# Patient Record
Sex: Male | Born: 1985 | Race: Black or African American | Hispanic: No | Marital: Single | Smoking: Never smoker
Health system: Southern US, Community
[De-identification: ages and names within clinical notes are randomized; demographics above are authoritative.]

## PROBLEM LIST (undated history)

## (undated) DIAGNOSIS — J45909 Unspecified asthma, uncomplicated: Secondary | ICD-10-CM

---
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
## 2012-09-01 ENCOUNTER — Emergency Department (HOSPITAL_COMMUNITY)
Admission: EM | Admit: 2012-09-01 | Discharge: 2012-09-01 | Disposition: A | Discharge status: Home / Self Care | Payer: Self-pay | Attending: Emergency Medicine | Admitting: Emergency Medicine

## 2012-09-01 ENCOUNTER — Encounter (HOSPITAL_COMMUNITY): Payer: Self-pay | Admitting: *Deleted

## 2012-09-01 ENCOUNTER — Emergency Department (HOSPITAL_COMMUNITY): Payer: Self-pay

## 2012-09-01 DIAGNOSIS — F17200 Nicotine dependence, unspecified, uncomplicated: Secondary | ICD-10-CM | POA: Insufficient documentation

## 2012-09-01 DIAGNOSIS — J45909 Unspecified asthma, uncomplicated: Secondary | ICD-10-CM | POA: Insufficient documentation

## 2012-09-01 DIAGNOSIS — A084 Viral intestinal infection, unspecified: Secondary | ICD-10-CM

## 2012-09-01 DIAGNOSIS — K5289 Other specified noninfective gastroenteritis and colitis: Secondary | ICD-10-CM | POA: Insufficient documentation

## 2012-09-01 DIAGNOSIS — B9789 Other viral agents as the cause of diseases classified elsewhere: Secondary | ICD-10-CM | POA: Insufficient documentation

## 2012-09-01 DIAGNOSIS — R509 Fever, unspecified: Secondary | ICD-10-CM | POA: Insufficient documentation

## 2012-09-01 DIAGNOSIS — R109 Unspecified abdominal pain: Secondary | ICD-10-CM | POA: Insufficient documentation

## 2012-09-01 HISTORY — DX: Unspecified asthma, uncomplicated: J45.909

## 2012-09-01 LAB — COMPREHENSIVE METABOLIC PANEL
ALT: 25 U/L (ref 0–53)
AST: 30 U/L (ref 0–37)
Alkaline Phosphatase: 63 U/L (ref 39–117)
CO2: 26 mEq/L (ref 19–32)
Calcium: 10.4 mg/dL (ref 8.4–10.5)
Chloride: 101 mEq/L (ref 96–112)
GFR calc Af Amer: >90 mL/min (ref >90)
GFR calc non Af Amer: >90 mL/min (ref >90)
Glucose, Bld: 100 mg/dL — ABNORMAL HIGH (ref 70–99)
Sodium: 140 mEq/L (ref 135–145)
Total Bilirubin: 1.2 mg/dL (ref 0.3–1.2)

## 2012-09-01 LAB — CBC WITH DIFFERENTIAL/PLATELET
Eosinophils Relative: 0 % (ref 0–5)
HCT: 45 % (ref 39.0–52.0)
Lymphocytes Relative: 6 % — ABNORMAL LOW (ref 12–46)
Lymphs Abs: 0.9 10*3/uL (ref 0.7–4.0)
MCV: 87.5 fL (ref 78.0–100.0)
Neutro Abs: 13.8 10*3/uL — ABNORMAL HIGH (ref 1.7–7.7)
Platelets: 188 10*3/uL (ref 150–400)
RBC: 5.14 MIL/uL (ref 4.22–5.81)
WBC: 15.3 10*3/uL — ABNORMAL HIGH (ref 4.0–10.5)

## 2012-09-01 MED ORDER — SODIUM CHLORIDE 0.9 % IV BOLUS (SEPSIS)
1000.0000 mL | Freq: Once | INTRAVENOUS | Status: AC
  Administered 2012-09-01: 1000 mL via INTRAVENOUS

## 2012-09-01 MED ORDER — HYDROMORPHONE HCL PF 1 MG/ML IJ SOLN
1.0000 mg | Freq: Once | INTRAMUSCULAR | Status: AC
  Administered 2012-09-01: 1 mg via INTRAVENOUS
  Filled 2012-09-01: qty 1

## 2012-09-01 MED ORDER — IOHEXOL 300 MG/ML  SOLN
100.0000 mL | Freq: Once | INTRAMUSCULAR | Status: AC | PRN
  Administered 2012-09-01: 100 mL via INTRAVENOUS

## 2012-09-01 MED ORDER — IOHEXOL 300 MG/ML  SOLN
50.0000 mL | Freq: Once | INTRAMUSCULAR | Status: AC | PRN
  Administered 2012-09-01: 50 mL via ORAL

## 2012-09-01 MED ORDER — ONDANSETRON HCL 4 MG/2ML IJ SOLN
4.0000 mg | Freq: Once | INTRAMUSCULAR | Status: AC
  Administered 2012-09-01: 4 mg via INTRAVENOUS
  Filled 2012-09-01: qty 2

## 2012-09-01 MED ORDER — ONDANSETRON HCL 4 MG PO TABS: 4.0000 mg | ORAL_TABLET | Freq: Four times a day (QID) | ORAL | Status: DC

## 2012-09-01 NOTE — ED Notes (Signed)
Patient transported to CT via stretcher per rad tech

## 2012-09-01 NOTE — ED Provider Notes (Signed)
History     CSN: [id number]  Arrival date & time 09/01/12  1243   First MD Initiated Contact with Patient 09/01/12 1411      Chief Complaint  Patient presents with  . Emesis  . Chills    (Consider location/radiation/quality/duration/timing/severity/associated sxs/prior treatment) HPI Comments: 27 year old male presents to the emergency department complaining of sudden onset vomiting x1 day beginning at 2:00 this morning. States his continued to vomit "nonstop" since then. He is unable to keep anything down. Denies abdominal pain, however states after vomiting his abdomen feels tight for short period of time. Admits to associated subjective fever and chills. Denies diarrhea. He felt fine yesterday. States he ate a corn dog and some cereal for dinner last night. No contacts With similar symptoms. Denies history of any abdominal surgeries or problems.  The history is provided by the patient.    Past Medical History  Diagnosis Date  . Asthma     History reviewed. No pertinent past surgical history.  No family history on file.  History  Substance Use Topics  . Smoking status: Current Every Day Smoker  . Smokeless tobacco: Not on file  . Alcohol Use: No      Review of Systems  Constitutional: Positive for fever, chills and appetite change.  Gastrointestinal: Positive for nausea, vomiting and abdominal pain. Negative for diarrhea. Abdominal distention: only after vomiting.  Musculoskeletal: Negative for back pain.  All other systems reviewed and are negative.    Allergies  Review of patient's allergies indicates no known allergies.  Home Medications  No current outpatient prescriptions on file.  BP 109/50  Pulse 83  Temp(Src) 98.5 F (36.9 C) (Oral)  Resp 16  SpO2 100%  Physical Exam  Nursing note and vitals reviewed. Constitutional: He is oriented to person, place, and time. He appears well-developed and well-nourished. No distress.  HENT:  Head:  Normocephalic and atraumatic.  Mouth/Throat: Uvula is midline, oropharynx is clear and moist and mucous membranes are normal.  Eyes: Conjunctivae are normal.  Neck: Normal range of motion. Neck supple.  Cardiovascular: Normal rate, regular rhythm, normal heart sounds and intact distal pulses.   Pulmonary/Chest: Effort normal and breath sounds normal. No respiratory distress.  Abdominal: Soft. Normal appearance and bowel sounds are normal. He exhibits no mass. There is tenderness in the right upper quadrant, right lower quadrant and periumbilical area. There is guarding and tenderness at McBurney's point. There is no rigidity, no rebound and no CVA tenderness.  No peritoneal signs.  Musculoskeletal: Normal range of motion. He exhibits no edema.  Neurological: He is alert and oriented to person, place, and time.  Skin: Skin is warm and dry.  Psychiatric: He has a normal mood and affect. His behavior is normal.    ED Course  Procedures (including critical care time)  Labs Reviewed  CBC WITH DIFFERENTIAL - Abnormal; Notable for the following:    WBC 15.3 (*)    Neutrophils Relative 90 (*)    Neutro Abs 13.8 (*)    Lymphocytes Relative 6 (*)    All other components within normal limits  COMPREHENSIVE METABOLIC PANEL - Abnormal; Notable for the following:    Glucose, Bld 100 (*)    All other components within normal limits  LIPASE, BLOOD   Ct Abdomen Pelvis W Contrast  09/01/2012  *RADIOLOGY REPORT*  Clinical Data: Emesis, abdominal tightness.  CT ABDOMEN AND PELVIS WITH CONTRAST  Technique:  Multidetector CT imaging of the abdomen and pelvis was performed following the  standard protocol during bolus administration of intravenous contrast.  Contrast: OMNIPAQUE IOHEXOL 300 MG/ML  SOLN  Comparison: None.  Findings: Visualized lung bases clear.  Unremarkable liver, nondilated gallbladder, spleen, adrenal glands, kidneys, pancreas, abdominal aorta, portal vein.  Stomach, small bowel, and  colon are nondilated.  Normal gas filled appendix.  Incomplete passage of oral contrast material through the small bowel.  No adenopathy localized.  No free air.  No ascites.  Urinary bladder incompletely distended.  Multiple pelvic phleboliths.  Regional bones unremarkable.  IMPRESSION:  1.  No acute abdominal process.  Normal appendix.   Original Report Authenticated By: D. Andria Rhein, MD      1. Viral gastroenteritis       MDM  27 year old male with viral gastroenteritis. Initial suspicion of appendicitis to to severe right-sided abdominal tenderness on exam with elevated white count. CT scan negative for any acute process. He is comfortable after receiving a dose of Dilaudid and 2 doses of Zofran. No longer nauseated. He is able to tolerate by mouth fluids. He is stable for discharge. I discussed BRAT diet and conservative measures. Return precautions discussed. Patient states understanding of plan and is agreeable.        Trevor Mace, PA-C 09/01/12 1857

## 2012-09-01 NOTE — ED Notes (Signed)
Patient transported from CT 

## 2012-09-01 NOTE — Discharge Instructions (Signed)
Use zofran for nausea. Eat a bland diet diet for the next few days. Return with worsening symptoms.  Viral Gastroenteritis Viral gastroenteritis is also known as stomach flu. This condition affects the stomach and intestinal tract. It can cause sudden diarrhea and vomiting. The illness typically lasts 3 to 8 days. Most people develop an immune response that eventually gets rid of the virus. While this natural response develops, the virus can make you quite ill. CAUSES  Many different viruses can cause gastroenteritis, such as rotavirus or noroviruses. You can catch one of these viruses by consuming contaminated food or water. You may also catch a virus by sharing utensils or other personal items with an infected person or by touching a contaminated surface. SYMPTOMS  The most common symptoms are diarrhea and vomiting. These problems can cause a severe loss of body fluids (dehydration) and a body salt (electrolyte) imbalance. Other symptoms may include:  Fever.  Headache.  Fatigue.  Abdominal pain. DIAGNOSIS  Your caregiver can usually diagnose viral gastroenteritis based on your symptoms and a physical exam. A stool sample may also be taken to test for the presence of viruses or other infections. TREATMENT  This illness typically goes away on its own. Treatments are aimed at rehydration. The most serious cases of viral gastroenteritis involve vomiting so severely that you are not able to keep fluids down. In these cases, fluids must be given through an intravenous line (IV). HOME CARE INSTRUCTIONS   Drink enough fluids to keep your urine clear or pale yellow. Drink small amounts of fluids frequently and increase the amounts as tolerated.  Ask your caregiver for specific rehydration instructions.  Avoid:  Foods high in sugar.  Alcohol.  Carbonated drinks.  Tobacco.  Juice.  Caffeine drinks.  Extremely hot or cold fluids.  Fatty, greasy foods.  Too much intake of anything at  one time.  Dairy products until 24 to 48 hours after diarrhea stops.  You may consume probiotics. Probiotics are active cultures of beneficial bacteria. They may lessen the amount and number of diarrheal stools in adults. Probiotics can be found in yogurt with active cultures and in supplements.  Wash your hands well to avoid spreading the virus.  Only take over-the-counter or prescription medicines for pain, discomfort, or fever as directed by your caregiver. Do not give aspirin to children. Antidiarrheal medicines are not recommended.  Ask your caregiver if you should continue to take your regular prescribed and over-the-counter medicines.  Keep all follow-up appointments as directed by your caregiver. SEEK IMMEDIATE MEDICAL CARE IF:   You are unable to keep fluids down.  You do not urinate at least once every 6 to 8 hours.  You develop shortness of breath.  You notice blood in your stool or vomit. This may look like coffee grounds.  You have abdominal pain that increases or is concentrated in one small area (localized).  You have persistent vomiting or diarrhea.  You have a fever.  The patient is a child younger than 3 months, and he or she has a fever.  The patient is a child older than 3 months, and he or she has a fever and persistent symptoms.  The patient is a child older than 3 months, and he or she has a fever and symptoms suddenly get worse.  The patient is a baby, and he or she has no tears when crying. MAKE SURE YOU:   Understand these instructions.  Will watch your condition.  Will get help right  away if you are not doing well or get worse. Document Released: 07/08/2005 Document Revised: 09/30/2011 Document Reviewed: 04/24/2011 [facility] Patient Information 2013 [place], [place].  B.R.A.T. Diet Your doctor has recommended the B.R.A.T. diet for you or your child until the condition improves. This is often used to help control diarrhea and vomiting symptoms.  If you or your child can tolerate clear liquids, you may have:  Bananas.   Rice.   Applesauce.   Toast (and other simple starches such as crackers, potatoes, noodles).  Be sure to avoid dairy products, meats, and fatty foods until symptoms are better. Fruit juices such as apple, grape, and prune juice can make diarrhea worse. Avoid these. Continue this diet for 2 days or as instructed by your caregiver. Document Released: 07/08/2005 Document Revised: 06/27/2011 Document Reviewed: 12/25/2006 [facility] Patient Information 2012 [place], [place].

## 2012-09-01 NOTE — ED Notes (Signed)
Pt c/o of acute onset emesis at 2 am and pt has continued to vomit "non-stop" since then.  Pt c/o abd "tightness" after emesis but denies diarrhea.Marland Kitchen

## 2012-09-02 ENCOUNTER — Telehealth (HOSPITAL_COMMUNITY): Payer: Self-pay | Admitting: Emergency Medicine

## 2012-09-02 NOTE — ED Notes (Addendum)
Pt calling stating Rx was to be called to Pharmacy.  Pt informed per dc instructions Rx was printed.  Described Rx to pt.

## 2012-09-04 NOTE — ED Provider Notes (Signed)
Medical screening examination/treatment/procedure(s) were performed by non-physician practitioner and as supervising physician I was immediately available for consultation/collaboration.    Nelia Shi, MD 09/04/12 1128

## 2013-06-26 ENCOUNTER — Ambulatory Visit: Payer: BC Managed Care – PPO

## 2013-06-26 ENCOUNTER — Ambulatory Visit (INDEPENDENT_AMBULATORY_CARE_PROVIDER_SITE_OTHER): Payer: BC Managed Care – PPO | Admitting: Family Medicine

## 2013-06-26 VITALS — BP 110/78 | HR 70 | Temp 98.3°F | Resp 16

## 2013-06-26 DIAGNOSIS — S93409A Sprain of unspecified ligament of unspecified ankle, initial encounter: Secondary | ICD-10-CM

## 2013-06-26 DIAGNOSIS — M25572 Pain in left ankle and joints of left foot: Secondary | ICD-10-CM

## 2013-06-26 DIAGNOSIS — M25579 Pain in unspecified ankle and joints of unspecified foot: Secondary | ICD-10-CM

## 2013-06-26 DIAGNOSIS — S93402A Sprain of unspecified ligament of left ankle, initial encounter: Secondary | ICD-10-CM

## 2013-06-26 MED ORDER — MELOXICAM 7.5 MG PO TABS: 7.5000 mg | ORAL_TABLET | Freq: Every day | ORAL | Status: AC

## 2013-06-26 MED ORDER — HYDROCODONE-ACETAMINOPHEN 5-325 MG PO TABS: 1.0000 | ORAL_TABLET | Freq: Three times a day (TID) | ORAL | Status: AC | PRN

## 2013-06-26 NOTE — Progress Notes (Addendum)
[facility] [address], [place] [address] [phone]  Date:  06/26/2013   Name:  Donald Mcfarland   DOB:  03-Feb-1986   MRN:  [id number]  PCP:  Pcp Not In System    Chief Complaint: Ankle Pain   History of Present Illness:  Donald Mcfarland is a 27 y.o. very pleasant male patient who presents with the following:  He is here today with a left ankle injury.  He jumped up to catch a ball at football practice earlier today.  He heard and felt a pop in his left ankle. He is not sure if he turned his ankle or what happened.  He is in a semi- pro football league.   Otherwise he is unhurt.   Generally he is healthy He is having a hard time bearing weight on the ankle and is in a WC in the office now.    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  He would like to follow-up with them  There are no active problems to display for this patient.   Past Medical History  Diagnosis Date  . Asthma     No past surgical history on file.  History  Substance Use Topics  . Smoking status: Never Smoker   . Smokeless tobacco: Not on file     Comment: smokes black and milds  . Alcohol Use: No    No family history on file.  No Known Allergies  Medication list has been reviewed and updated.  No current outpatient prescriptions on file prior to visit.   No current facility-administered medications on file prior to visit.    Review of Systems:  As per HPI- otherwise negative.   Physical Examination: Filed Vitals:   06/26/13 1401  BP: 110/78  Pulse: 70  Temp: 98.3 F (36.8 C)  Resp: 16   There were no vitals filed for this visit. There is no height or weight on file to calculate BMI. Ideal Body Weight:    GEN: WDWN, NAD, Non-toxic, A & O x 3 HEENT: Atraumatic, Normocephalic. Neck supple. No masses, No LAD. Ears and Nose: No external deformity. CV: RRR, No M/G/R. No JVD. No thrill. No extra heart  sounds. PULM: CTA B, no wheezes, crackles, rhonchi. No retractions. No resp. distress. No accessory muscle use. ABD: S, NT, ND, +BS. No rebound. No HSM. EXTR: No c/c/e NEURO brought back in [facility] PSYCH: Normally interactive. Conversant. Not depressed or anxious appearing.  Calm demeanor.  Left ankle:  He is quite tender over the lateral malleolus.  Achilles intact.  Mild swelling over ankle, no bruise noted.  Foot NV intact.  No wound   [facility] reading (PRIMARY) by  Dr. Patsy Lager. Left foot: negative Left ankle: negative, old ossicle at lateral malleolus.  LEFT FOOT - COMPLETE 3+ VIEW  COMPARISON: None.  FINDINGS: There is no evidence of fracture or dislocation. There is no evidence of arthropathy or other focal bone abnormality. Soft tissues are unremarkable.  IMPRESSION: Negative.  LEFT ANKLE COMPLETE - 3+ VIEW  COMPARISON: Concurrently obtained radiographs of the foot  FINDINGS:  There is no evidence of fracture, dislocation, or joint effusion.  Small well corticated ossicle inferior to the distal fibula may  represents an accessory ossicle versus sequelae of remote prior  trauma. There is no evidence of arthropathy or other focal bone  abnormality. Soft tissues are unremarkable.  IMPRESSION:  Negative.   Assessment  and Plan: Left ankle sprain, initial encounter - Plan: meloxicam (MOBIC) 7.5 MG tablet, HYDROcodone-acetaminophen (NORCO/VICODIN) 5-325 MG per tablet, Ambulatory referral to Orthopedic Surgery  Pain in left ankle - Plan: DG Ankle Complete Left, DG Foot Complete Left  Placed in a CAM boot and given crutches, vicodin to use if needed.  Referral to [facility] for follow-up; he may need PT to achieve full function of his ankle.    Signed Abbe Amsterdam, MD

## 2013-06-26 NOTE — Patient Instructions (Signed)
Ice and elevate your ankle.  I will refer you to [facility] for evaluation of your ankle.  Use the mobic as needed for pain. If you have more severe pain you can use the hydrocodone but be cautious of sedation.   Use your crutches as needed, and the boot to protect your ankle.

## 2013-07-01 ENCOUNTER — Telehealth: Payer: Self-pay | Admitting: Family Medicine

## 2013-07-01 NOTE — Telephone Encounter (Signed)
Called and LMOM.  It looks like he has a balance with [facility].  This needs to be addressed in some way before they will see him.  Asked him to please give them a call to arrange some sort of payment plan.  otherwise he is welcome to come and see [place] again for his ankle if he would like

## 2013-07-23 ENCOUNTER — Telehealth: Payer: Self-pay | Admitting: Family Medicine

## 2013-07-23 NOTE — Telephone Encounter (Signed)
Called to check on him- his ankle is now fine, referral no longer needed

## 2014-08-22 IMAGING — CT CT ABD-PELV W/ CM
2 of 4 series · 13 of 32 positions shown, 18 images · IV contrast (water/omni  & 100ml omni 300)
Comparison: None.

CLINICAL DATA: Emesis, abdominal tightness.

CT ABDOMEN AND PELVIS WITH CONTRAST
TECHNIQUE: Multidetector CT imaging of the abdomen and pelvis was
performed following the standard protocol during bolus
administration of intravenous contrast.
Contrast: 100mL OMNIPAQUE IOHEXOL 300 MG/ML  SOLN

[Series 2: routine abdomen · axial · 0.70mm/px · z∈[-470,-190]mm · 5 of 86 slices shown, 10 images]
[im 15/86  soft-tissue]
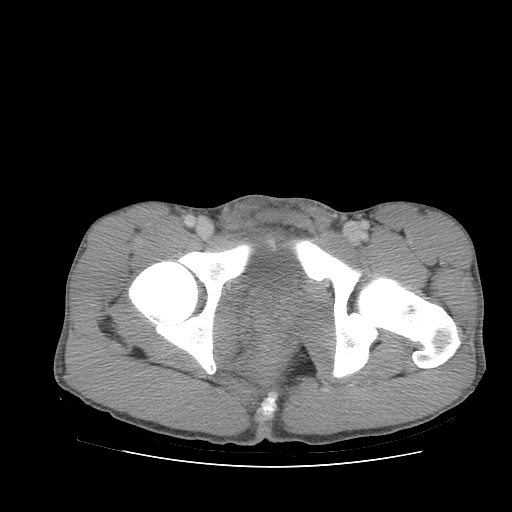
[im 15/86  bone]
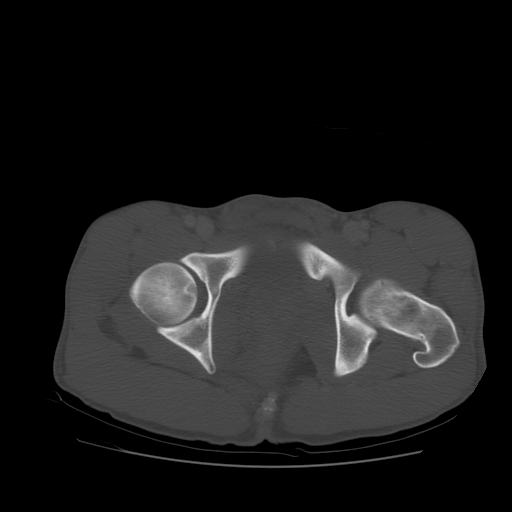
[im 29/86  soft-tissue]
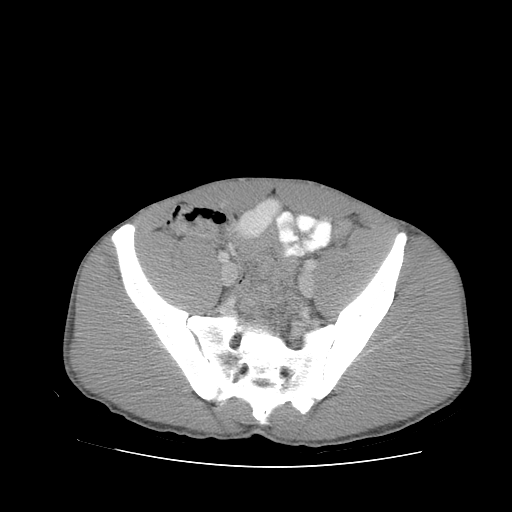
[im 29/86  lung]
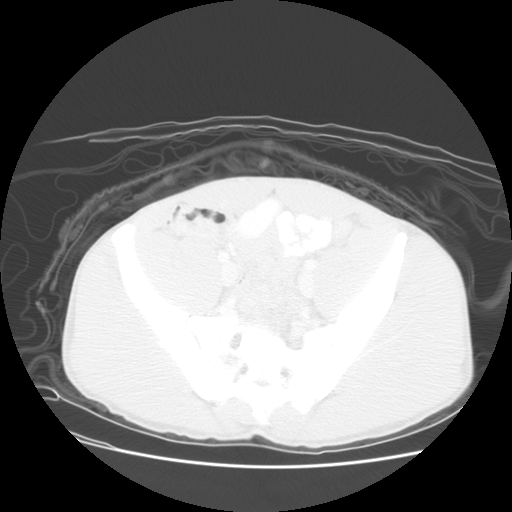
[im 43/86  soft-tissue]
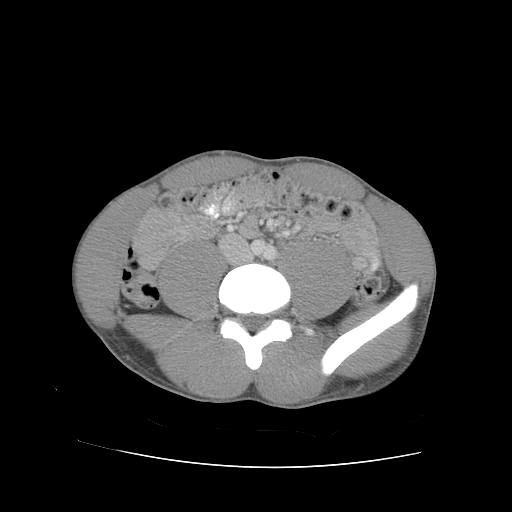
[im 43/86  lung]
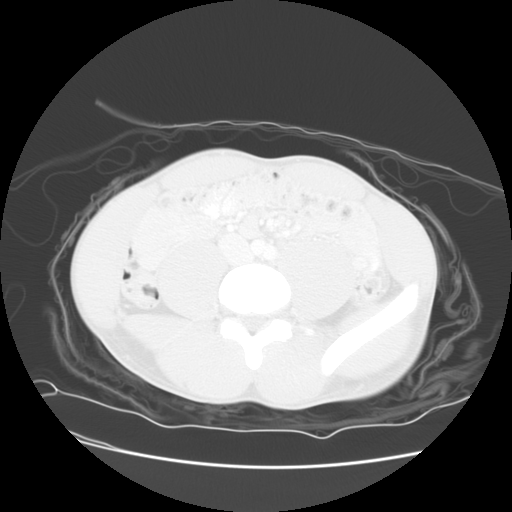
[im 57/86  soft-tissue]
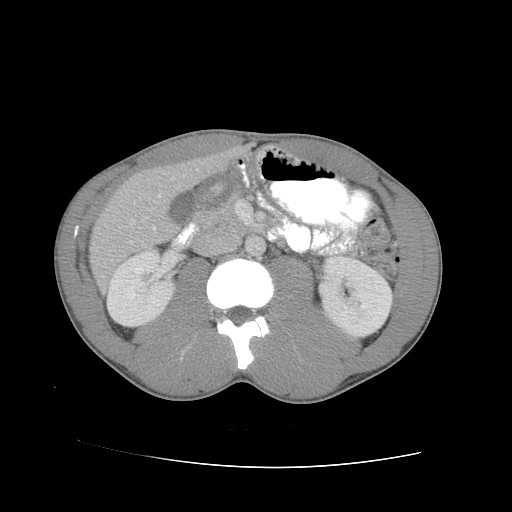
[im 57/86  lung]
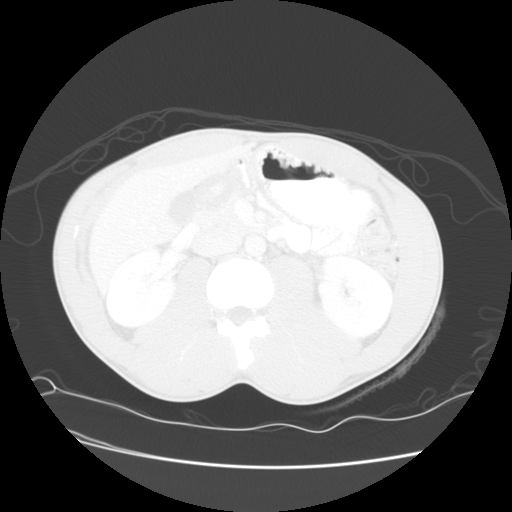
[im 71/86  soft-tissue]
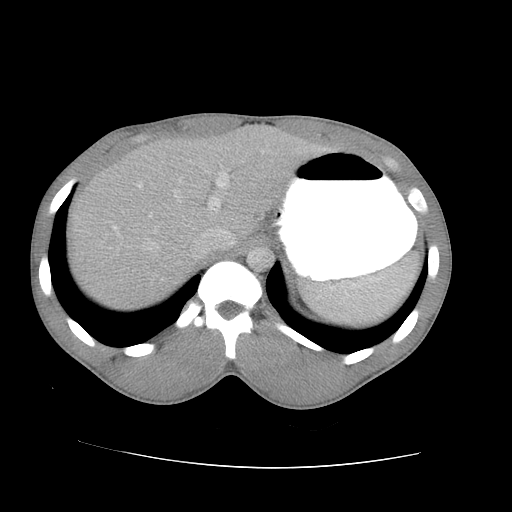
[im 71/86  lung]
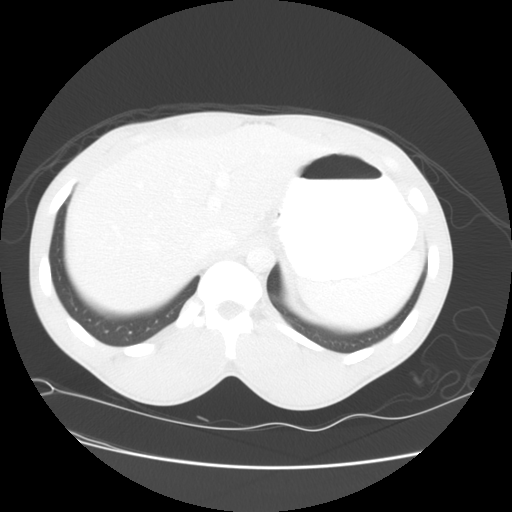

[Series 401: sag · sagittal · 0.85mm/px · 8 of 152 slices shown]
[im 13/152  soft-tissue]
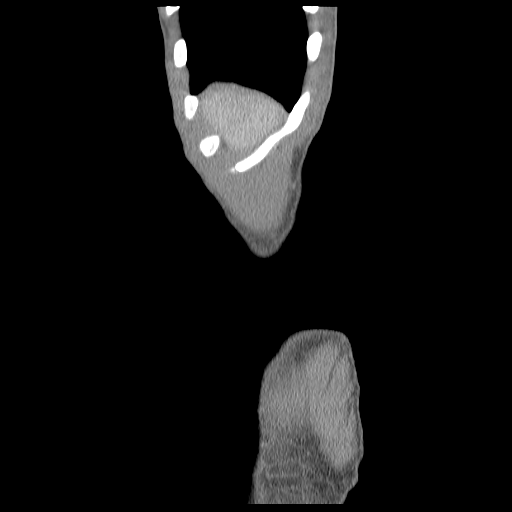
[im 38/152  soft-tissue]
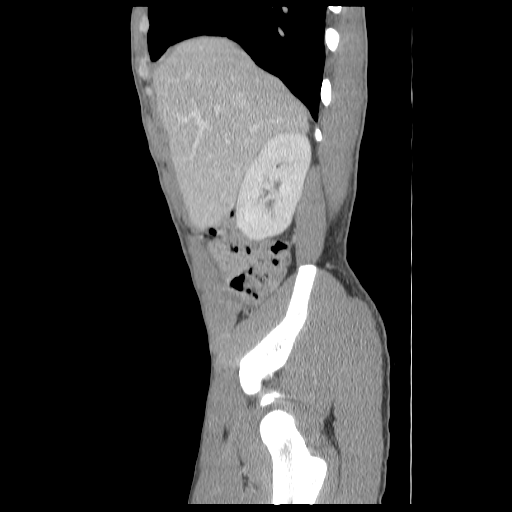
[im 51/152  soft-tissue]
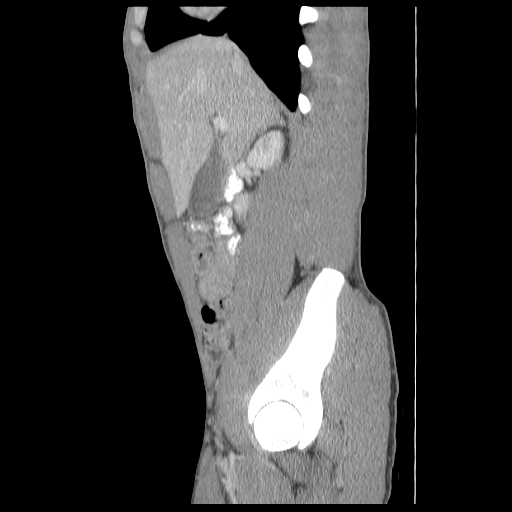
[im 63/152  soft-tissue]
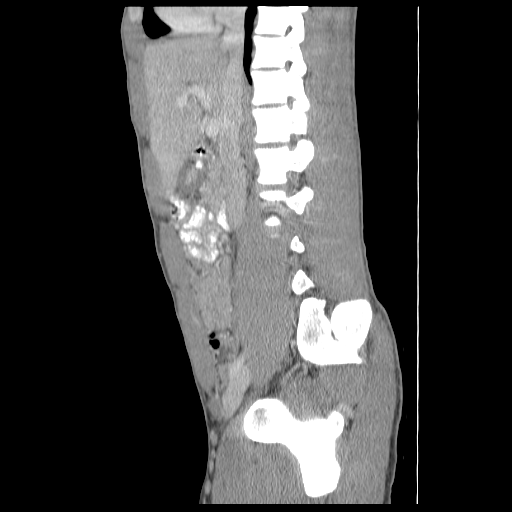
[im 89/152  soft-tissue]
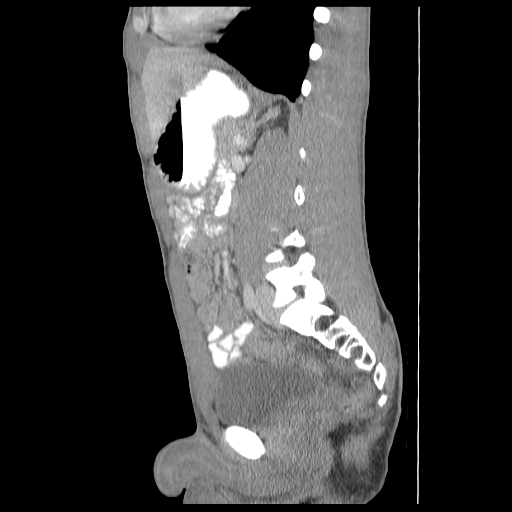
[im 101/152  soft-tissue]
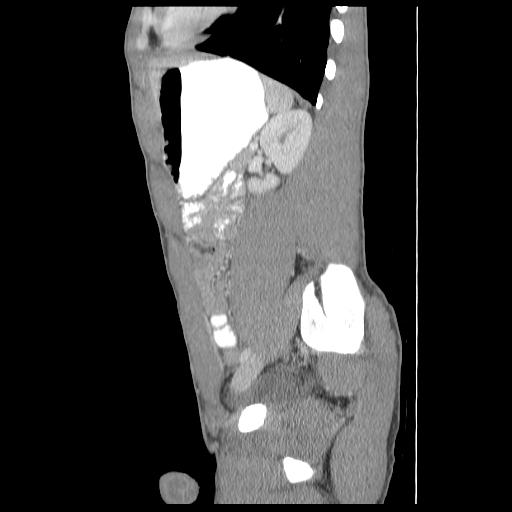
[im 114/152  soft-tissue]
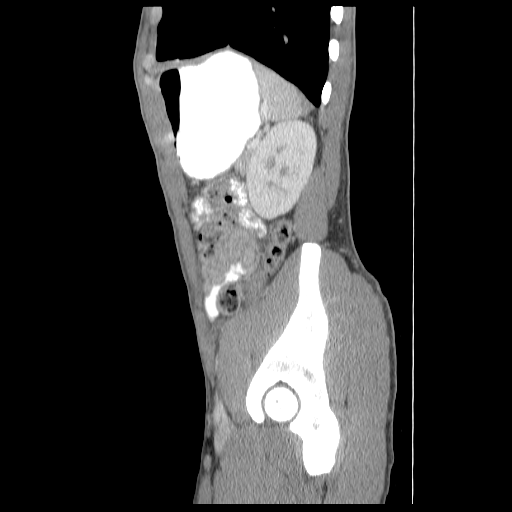
[im 139/152  soft-tissue]
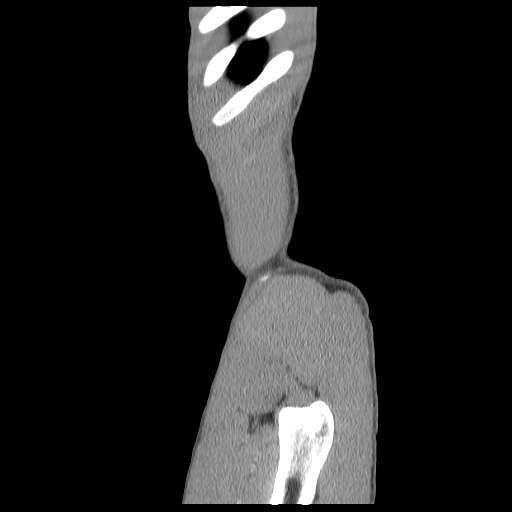

[13 of 32 positions shown; findings below may reference images not displayed]

FINDINGS: Visualized lung bases clear.  Unremarkable liver,
nondilated gallbladder, spleen, adrenal glands, kidneys, pancreas,
abdominal aorta, portal vein.  Stomach, small bowel, and colon are
nondilated.  Normal gas filled appendix.  Incomplete passage of
oral contrast material through the small bowel.  No adenopathy
localized.  No free air.  No ascites.  Urinary bladder incompletely
distended.  Multiple pelvic phleboliths.  Regional bones
unremarkable.
IMPRESSION: 1.  No acute abdominal process.  Normal appendix.

## 2015-06-16 IMAGING — CR DG FOOT COMPLETE 3+V*L*
3 series · 3 of 3 positions shown · non-contrast
Comparison: None.

CLINICAL DATA: Left foot pain post injury

EXAM:
LEFT FOOT - COMPLETE 3+ VIEW

[AP]
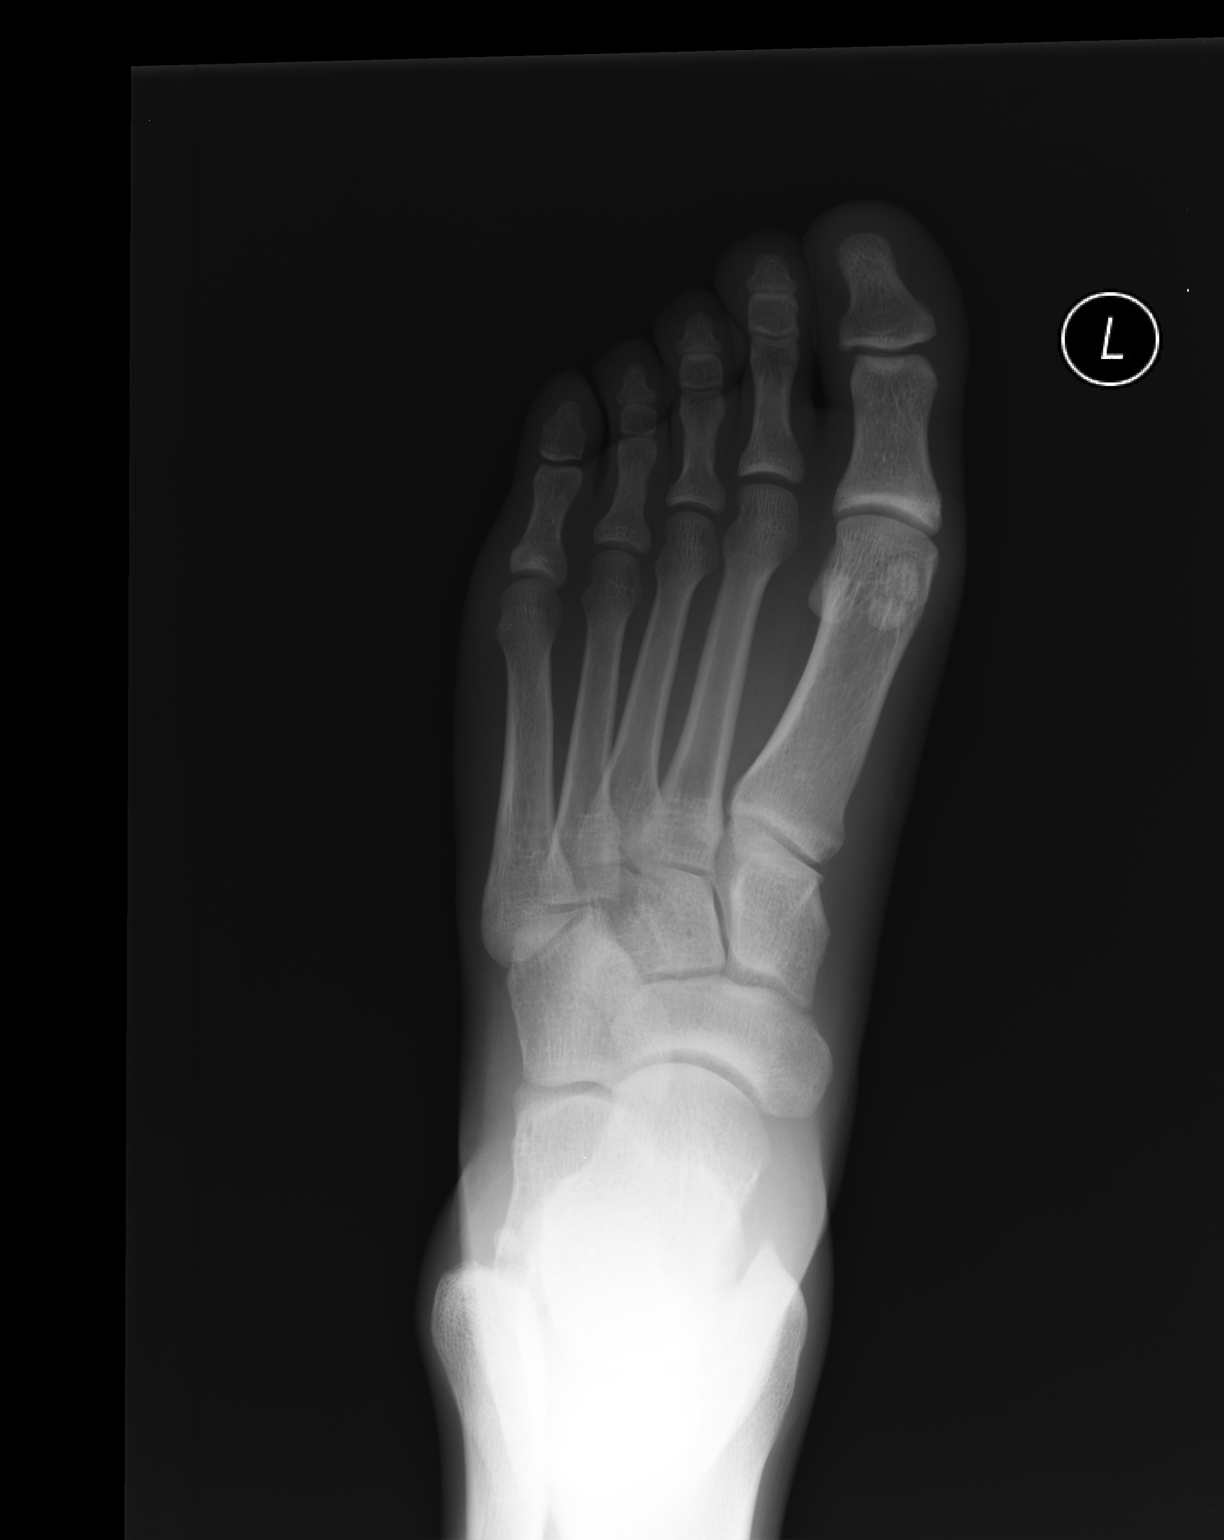

[ap obl int rot]
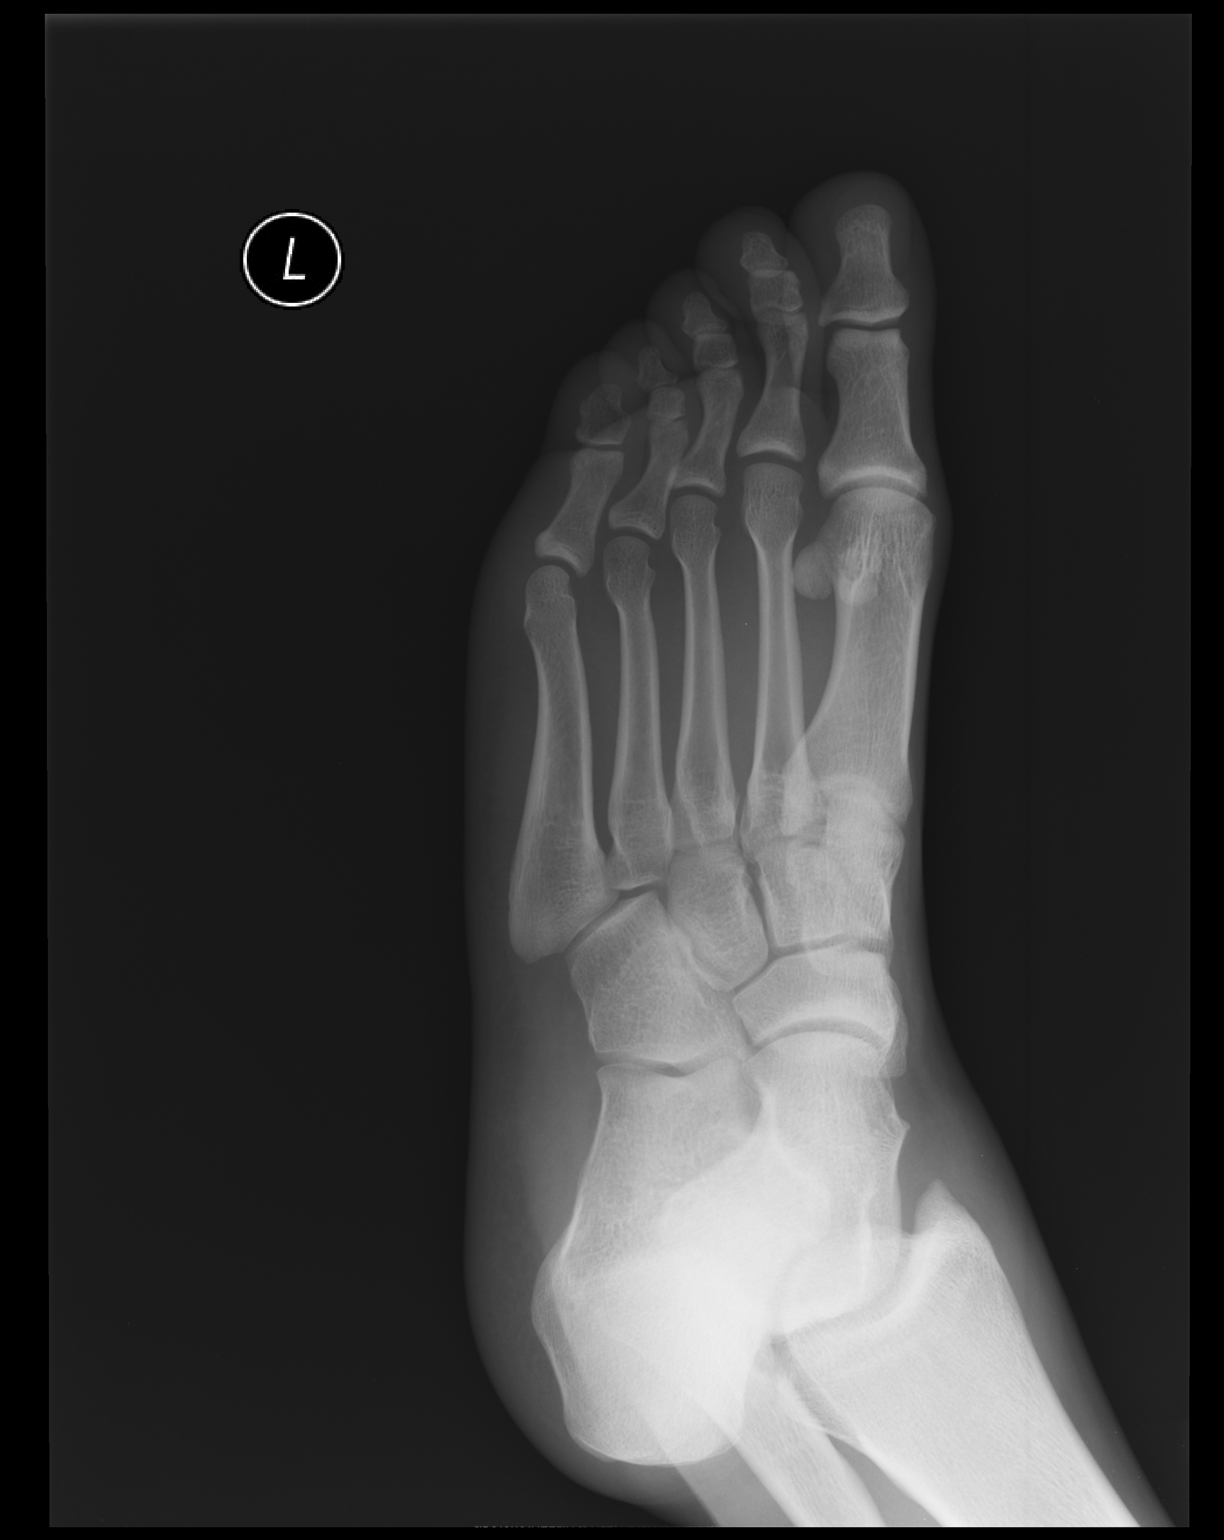

[lateral]
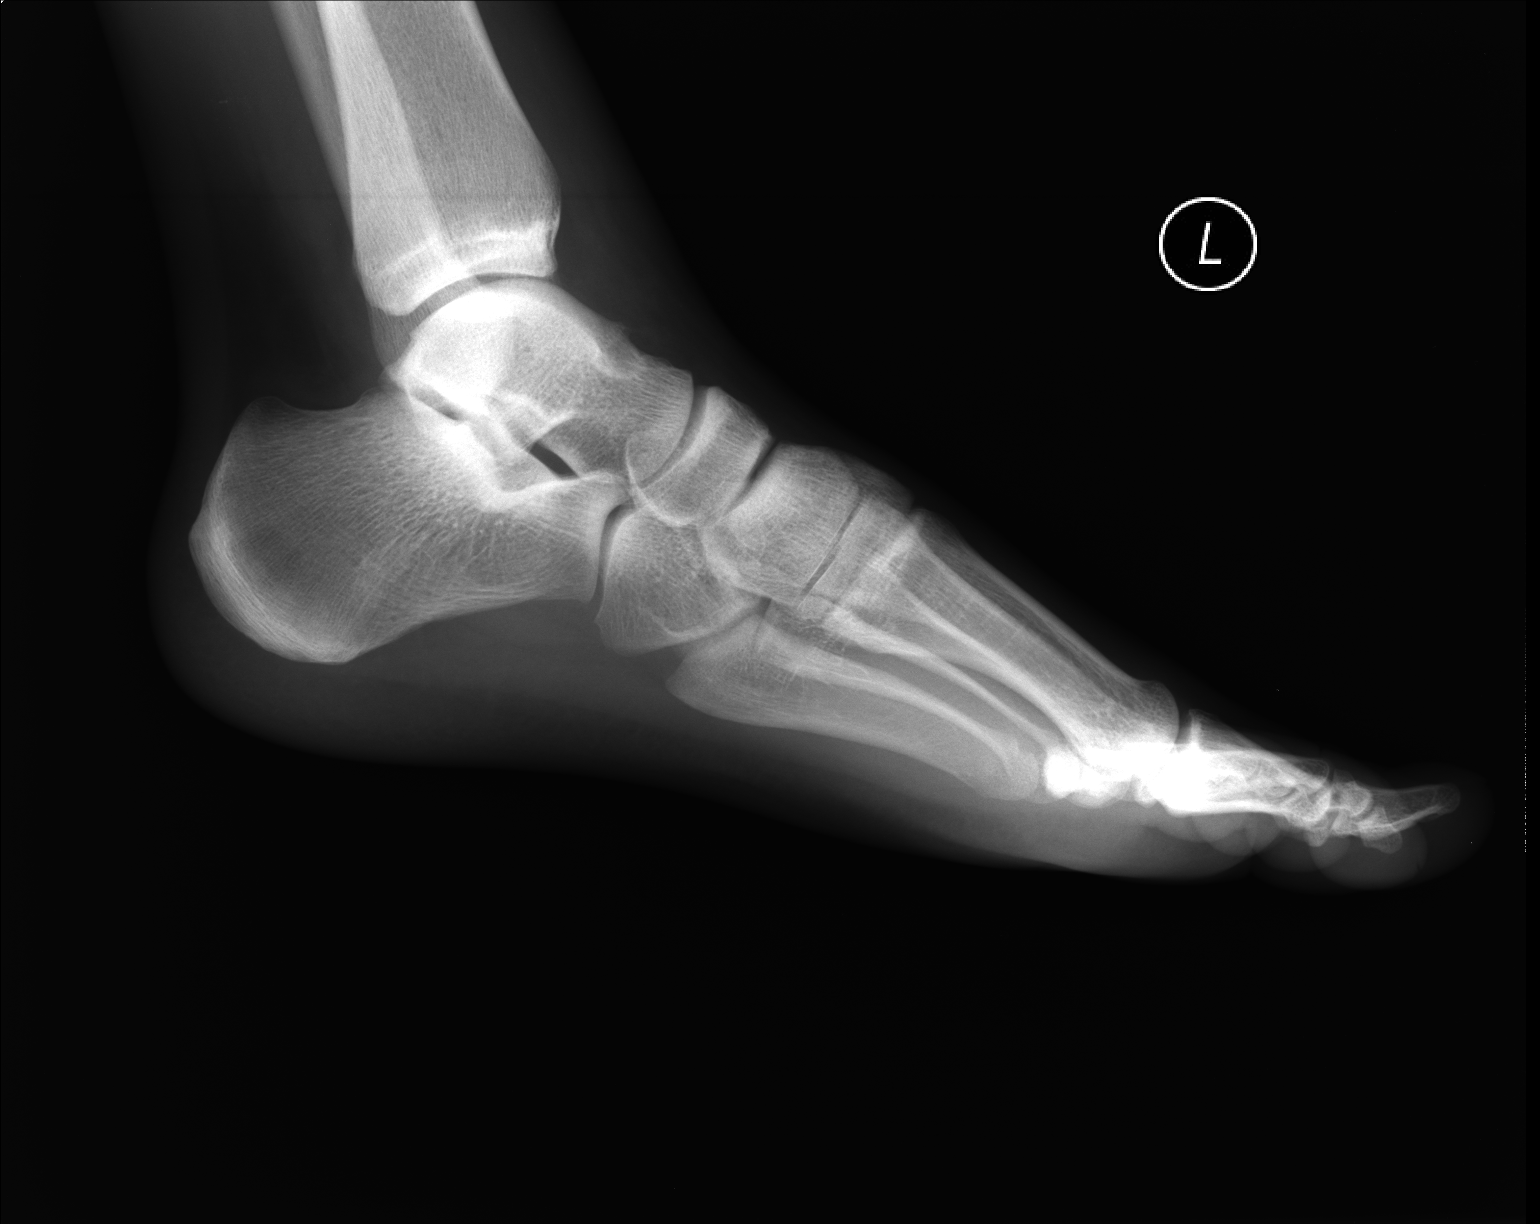

[3 of 3 positions shown; findings below may reference images not displayed]

FINDINGS: There is no evidence of fracture or dislocation. There is no
evidence of arthropathy or other focal bone abnormality. Soft
tissues are unremarkable.
IMPRESSION: Negative.

## 2021-01-09 ENCOUNTER — Encounter (HOSPITAL_COMMUNITY): Payer: Self-pay | Admitting: Emergency Medicine

## 2021-01-09 ENCOUNTER — Other Ambulatory Visit: Payer: Self-pay

## 2021-01-09 ENCOUNTER — Ambulatory Visit (HOSPITAL_COMMUNITY)
Admission: EM | Admit: 2021-01-09 | Discharge: 2021-01-09 | Disposition: A | Discharge status: Home / Self Care | Payer: Self-pay | Attending: Physician Assistant | Admitting: Physician Assistant

## 2021-01-09 DIAGNOSIS — L509 Urticaria, unspecified: Secondary | ICD-10-CM

## 2021-01-09 MED ORDER — PREDNISONE 10 MG (21) PO TBPK: ORAL_TABLET | ORAL | 0 refills | Status: DC

## 2021-01-09 NOTE — ED Provider Notes (Signed)
[facility]    CSN: [id number] Arrival date & time: 01/09/21  8786      History   Chief Complaint Chief Complaint  Patient presents with   Rash    HPI Donald Mcfarland is a 35 y.o. male.   Patient here c/w hives x 2 - 3 days. No known trigger.  No changes in foods, soaps, detergents.  No new medications.  Denies insect bites.  Started L lower leg, now over entire body.  Admits slight facial swelling.  Denies SOB, cough, wheezing.  He has not taken any benadryl or antihistamine.     Past Medical History:  Diagnosis Date   Asthma     There are no problems to display for this patient.   History reviewed. No pertinent surgical history.     Home Medications    Prior to Admission medications   Medication Sig Start Date End Date Taking? Authorizing Provider  predniSONE (STERAPRED UNI-PAK 21 TAB) 10 MG (21) TBPK tablet Take as directed on packaging 01/09/21  Yes Evern Core, PA-C  HYDROcodone-acetaminophen (NORCO/VICODIN) 5-325 MG per tablet Take 1 tablet by mouth every 8 (eight) hours as needed for moderate pain. 06/26/13   Copland, Gwenlyn Found, MD  meloxicam (MOBIC) 7.5 MG tablet Take 1 tablet (7.5 mg total) by mouth daily. 06/26/13   Copland, Gwenlyn Found, MD    Family History History reviewed. No pertinent family history.  Social History Social History   Tobacco Use   Smoking status: Never   Tobacco comments:    smokes black and milds  Substance Use Topics   Alcohol use: No   Drug use: No     Allergies   Patient has no known allergies.   Review of Systems Review of Systems  Constitutional:  Negative for chills, fatigue and fever.  HENT:  Positive for facial swelling. Negative for congestion, ear pain, nosebleeds, postnasal drip, rhinorrhea, sinus pressure, sinus pain and sore throat.   Eyes:  Negative for pain and redness.  Respiratory:  Negative for cough, shortness of breath and wheezing.   Gastrointestinal:  Negative for abdominal pain,  diarrhea, nausea and vomiting.  Musculoskeletal:  Negative for arthralgias and myalgias.  Skin:  Positive for rash.  Allergic/Immunologic: Negative for environmental allergies, food allergies and immunocompromised state.  Neurological:  Negative for light-headedness and headaches.  Hematological:  Negative for adenopathy. Does not bruise/bleed easily.  Psychiatric/Behavioral:  Negative for confusion and sleep disturbance.     Physical Exam Triage Vital Signs ED Triage Vitals  Enc Vitals Group     BP 01/09/21 0833 109/70     Pulse Rate 01/09/21 0833 79     Resp 01/09/21 0833 16     Temp 01/09/21 0833 98.8 F (37.1 C)     Temp Source 01/09/21 0833 Oral     SpO2 01/09/21 0833 100 %     Weight --      Height --      Head Circumference --      Peak Flow --      Pain Score 01/09/21 0831 2     Pain Loc --      Pain Edu? --      Excl. in GC? --    No data found.  Updated Vital Signs BP 109/70 (BP Location: Left Arm)   Pulse 79   Temp 98.8 F (37.1 C) (Oral)   Resp 16   SpO2 100%   Visual Acuity Right Eye Distance:   Left Eye  Distance:   Bilateral Distance:    Right Eye Near:   Left Eye Near:    Bilateral Near:     Physical Exam Vitals and nursing note reviewed.  Constitutional:      General: He is not in acute distress.    Appearance: Normal appearance. He is not ill-appearing.  HENT:     Head: Normocephalic and atraumatic.     Comments: Mild puffiness noted around b/l eyes    Right Ear: Tympanic membrane and ear canal normal.     Left Ear: Tympanic membrane and ear canal normal.     Mouth/Throat:     Lips: No lesions.     Mouth: No angioedema.     Tongue: No lesions.     Pharynx: Oropharynx is clear. Uvula midline. No posterior oropharyngeal erythema or uvula swelling.     Tonsils: No tonsillar exudate.  Eyes:     General: No scleral icterus.    Extraocular Movements: Extraocular movements intact.     Conjunctiva/sclera: Conjunctivae normal.   Cardiovascular:     Rate and Rhythm: Normal rate and regular rhythm.     Heart sounds: No murmur heard. Pulmonary:     Effort: Pulmonary effort is normal. No respiratory distress.     Breath sounds: Normal breath sounds. No wheezing or rales.  Musculoskeletal:     Cervical back: Normal range of motion. No rigidity.  Skin:    Capillary Refill: Capillary refill takes less than 2 seconds.     Coloration: Skin is not jaundiced.     Findings: Rash present. Rash is urticarial.     Comments: Wheels noted throughout body  Neurological:     General: No focal deficit present.     Mental Status: He is alert and oriented to person, place, and time.     Motor: No weakness.     Gait: Gait normal.  Psychiatric:        Mood and Affect: Mood normal.        Behavior: Behavior normal.     UC Treatments / Results  Labs (all labs ordered are listed, but only abnormal results are displayed) Labs Reviewed - No data to display  EKG   Radiology No results found.  Procedures Procedures (including critical care time)  Medications Ordered in UC Medications - No data to display  Initial Impression / Assessment and Plan / UC Course  I have reviewed the triage vital signs and the nursing notes.  Pertinent labs & imaging results that were available during my care of the patient were reviewed by me and considered in my medical decision making (see chart for details).     Take medication as prescribed Take benadryl and zyrtec  Return if sx worsen or fail to improve. Final Clinical Impressions(s) / UC Diagnoses   Final diagnoses:  Urticaria   Discharge Instructions   None    ED Prescriptions     Medication Sig Dispense Auth. Provider   predniSONE (STERAPRED UNI-PAK 21 TAB) 10 MG (21) TBPK tablet Take as directed on packaging 21 tablet Evern Core, PA-C      PDMP not reviewed this encounter.   Evern Core, PA-C 01/09/21 0900

## 2021-01-09 NOTE — ED Triage Notes (Signed)
Pt presents with rash/ hives on legs and swollen right eye that started on Sunday. States has spread all over body since yesterday.

## 2023-01-02 ENCOUNTER — Emergency Department (HOSPITAL_COMMUNITY): Payer: BC Managed Care – PPO

## 2023-01-02 ENCOUNTER — Other Ambulatory Visit: Payer: Self-pay

## 2023-01-02 ENCOUNTER — Emergency Department (HOSPITAL_COMMUNITY)
Admission: EM | Admit: 2023-01-02 | Discharge: 2023-01-02 | Disposition: A | Discharge status: Home / Self Care | Payer: BC Managed Care – PPO | Attending: Emergency Medicine | Admitting: Emergency Medicine

## 2023-01-02 DIAGNOSIS — X58XXXA Exposure to other specified factors, initial encounter: Secondary | ICD-10-CM | POA: Insufficient documentation

## 2023-01-02 DIAGNOSIS — S39012A Strain of muscle, fascia and tendon of lower back, initial encounter: Secondary | ICD-10-CM | POA: Diagnosis not present

## 2023-01-02 DIAGNOSIS — J45909 Unspecified asthma, uncomplicated: Secondary | ICD-10-CM | POA: Diagnosis not present

## 2023-01-02 DIAGNOSIS — S3992XA Unspecified injury of lower back, initial encounter: Secondary | ICD-10-CM | POA: Diagnosis present

## 2023-01-02 LAB — CBC WITH DIFFERENTIAL/PLATELET
Abs Immature Granulocytes: 0.01 10*3/uL (ref 0.00–0.07)
Basophils Absolute: 0 10*3/uL (ref 0.0–0.1)
Basophils Relative: 0 %
Eosinophils Absolute: 0 10*3/uL (ref 0.0–0.5)
Eosinophils Relative: 0 %
HCT: 42.1 % (ref 39.0–52.0)
Hemoglobin: 13.8 g/dL (ref 13.0–17.0)
Immature Granulocytes: 0 %
Lymphocytes Relative: 25 %
Lymphs Abs: 2.2 10*3/uL (ref 0.7–4.0)
MCH: 29.6 pg (ref 26.0–34.0)
MCHC: 32.8 g/dL (ref 30.0–36.0)
MCV: 90.1 fL (ref 80.0–100.0)
Monocytes Absolute: 0.6 10*3/uL (ref 0.1–1.0)
Monocytes Relative: 6 %
Neutro Abs: 6.2 10*3/uL (ref 1.7–7.7)
Neutrophils Relative %: 69 %
Platelets: 159 10*3/uL (ref 150–400)
RBC: 4.67 MIL/uL (ref 4.22–5.81)
RDW: 12.6 % (ref 11.5–15.5)
WBC: 9.1 10*3/uL (ref 4.0–10.5)
nRBC: 0 % (ref 0.0–0.2)

## 2023-01-02 LAB — COMPREHENSIVE METABOLIC PANEL
ALT: 12 U/L (ref 0–44)
AST: 18 U/L (ref 15–41)
Albumin: 4.2 g/dL (ref 3.5–5.0)
Alkaline Phosphatase: 47 U/L (ref 38–126)
Anion gap: 9 (ref 5–15)
BUN: 8 mg/dL (ref 6–20)
CO2: 21 mmol/L — ABNORMAL LOW (ref 22–32)
Calcium: 9.3 mg/dL (ref 8.9–10.3)
Chloride: 107 mmol/L (ref 98–111)
Creatinine, Ser: 1.03 mg/dL (ref 0.61–1.24)
GFR, Estimated: >60 mL/min (ref >60)
Glucose, Bld: 95 mg/dL (ref 70–99)
Potassium: 3.8 mmol/L (ref 3.5–5.1)
Sodium: 137 mmol/L (ref 135–145)
Total Bilirubin: 1.1 mg/dL (ref 0.3–1.2)
Total Protein: 7 g/dL (ref 6.5–8.1)

## 2023-01-02 LAB — URINALYSIS, ROUTINE W REFLEX MICROSCOPIC
Bilirubin Urine: NEGATIVE
Glucose, UA: NEGATIVE mg/dL
Hgb urine dipstick: NEGATIVE
Ketones, ur: 5 mg/dL — AB
Leukocytes,Ua: NEGATIVE
Nitrite: NEGATIVE
Protein, ur: NEGATIVE mg/dL
Specific Gravity, Urine: 1.024 (ref 1.005–1.030)
pH: 5 (ref 5.0–8.0)

## 2023-01-02 MED ORDER — SODIUM CHLORIDE 0.9 % IV BOLUS
1000.0000 mL | Freq: Once | INTRAVENOUS | Status: AC
  Administered 2023-01-02: 1000 mL via INTRAVENOUS

## 2023-01-02 MED ORDER — DEXAMETHASONE SODIUM PHOSPHATE 10 MG/ML IJ SOLN
10.0000 mg | Freq: Once | INTRAMUSCULAR | Status: AC
  Administered 2023-01-02: 10 mg via INTRAVENOUS
  Filled 2023-01-02: qty 1

## 2023-01-02 MED ORDER — IBUPROFEN 600 MG PO TABS: 600.0000 mg | ORAL_TABLET | Freq: Four times a day (QID) | ORAL | 0 refills | Status: AC | PRN

## 2023-01-02 MED ORDER — METHOCARBAMOL 500 MG PO TABS: 500.0000 mg | ORAL_TABLET | Freq: Two times a day (BID) | ORAL | 0 refills | Status: AC

## 2023-01-02 MED ORDER — KETOROLAC TROMETHAMINE 30 MG/ML IJ SOLN
30.0000 mg | Freq: Once | INTRAMUSCULAR | Status: AC
  Administered 2023-01-02: 30 mg via INTRAVENOUS
  Filled 2023-01-02: qty 1

## 2023-01-02 NOTE — ED Provider Notes (Signed)
**Note Mcfarland via Obfuscation** [place] EMERGENCY DEPARTMENT AT [facility] Provider Note   CSN: [id number] Arrival date & time: 01/02/23  7846     History  Chief Complaint  Patient presents with   Back Pain    Donald Mcfarland is a 37 y.o. male.  Pt is a 37 yo male with pmhx significant for asthma and back pain.  Pt said he's been having back pain intermittently for about 8 years.  He has gone to the chiropractor, but that usually helps briefly.  He went to physical therapy yesterday and was given some exercises to do at home.  He said pain is worse today.  He has a lot of spasm.  He can't sleep.  He said pain radiates to his stomach.  Pt said a family member died of a AAA rupture and he had back pain before he died.  He was worried he had this.  No hx kidney stones.  No pain radiating down legs.  No trauma.  He drove here.       Home Medications Prior to Admission medications   Medication Sig Start Date End Date Taking? Authorizing Provider  acetaminophen (TYLENOL) 500 MG tablet Take 500 mg by mouth every 6 (six) hours as needed for mild pain.   Yes [provider]  ibuprofen (ADVIL) 600 MG tablet Take 1 tablet (600 mg total) by mouth every 6 (six) hours as needed. 01/02/23  Yes Jacalyn Lefevre, MD  methocarbamol (ROBAXIN) 500 MG tablet Take 1 tablet (500 mg total) by mouth 2 (two) times daily. 01/02/23  Yes Jacalyn Lefevre, MD  HYDROcodone-acetaminophen (NORCO/VICODIN) 5-325 MG per tablet Take 1 tablet by mouth every 8 (eight) hours as needed for moderate pain. Patient not taking: Reported on 01/02/2023 06/26/13   Copland, Gwenlyn Found, MD  meloxicam (MOBIC) 7.5 MG tablet Take 1 tablet (7.5 mg total) by mouth daily. Patient not taking: Reported on 01/02/2023 06/26/13   Copland, Gwenlyn Found, MD  predniSONE (STERAPRED UNI-PAK 21 TAB) 10 MG (21) TBPK tablet Take as directed on packaging Patient not taking: Reported on 01/02/2023 01/09/21   Evern Core, PA-C      Allergies    Patient has no known  allergies.    Review of Systems   Review of Systems  Musculoskeletal:  Positive for back pain.  All other systems reviewed and are negative.   Physical Exam Updated Vital Signs BP 123/87 (BP Location: Right Arm)   Pulse 62   Temp 98.1 F (36.7 C)   Resp 17   Ht 5\' 6"  (1.676 m)   Wt 63.5 kg   SpO2 100%   BMI 22.60 kg/m  Physical Exam Vitals and nursing note reviewed.  Constitutional:      Appearance: Normal appearance.  HENT:     Head: Normocephalic and atraumatic.     Right Ear: External ear normal.     Left Ear: External ear normal.     Nose: Nose normal.     Mouth/Throat:     Mouth: Mucous membranes are moist.     Pharynx: Oropharynx is clear.  Eyes:     Extraocular Movements: Extraocular movements intact.     Conjunctiva/sclera: Conjunctivae normal.     Pupils: Pupils are equal, round, and reactive to light.  Cardiovascular:     Rate and Rhythm: Normal rate and regular rhythm.     Pulses: Normal pulses.     Heart sounds: Normal heart sounds.  Pulmonary:     Effort: Pulmonary effort is normal.  Breath sounds: Normal breath sounds.  Abdominal:     General: Abdomen is flat. Bowel sounds are normal.     Palpations: Abdomen is soft.  Musculoskeletal:       Arms:     Cervical back: Normal range of motion and neck supple.  Skin:    General: Skin is warm.     Capillary Refill: Capillary refill takes less than 2 seconds.  Neurological:     General: No focal deficit present.     Mental Status: He is alert and oriented to person, place, and time.  Psychiatric:        Mood and Affect: Mood normal.        Behavior: Behavior normal.     ED Results / Procedures / Treatments   Labs (all labs ordered are listed, but only abnormal results are displayed) Labs Reviewed  COMPREHENSIVE METABOLIC PANEL - Abnormal; Notable for the following components:      Result Value   CO2 21 (*)    All other components within normal limits  URINALYSIS, ROUTINE W REFLEX  MICROSCOPIC - Abnormal; Notable for the following components:   Ketones, ur 5 (*)    All other components within normal limits  CBC WITH DIFFERENTIAL/PLATELET    EKG None  Radiology CT Renal Stone Study  Result Date: 01/02/2023 CLINICAL DATA:  37 year old male with abdomen and flank pain. Intermittent back pain. Most recent episode onset 3 days ago. EXAM: CT ABDOMEN AND PELVIS WITHOUT CONTRAST TECHNIQUE: Multidetector CT imaging of the abdomen and pelvis was performed following the standard protocol without IV contrast. RADIATION DOSE REDUCTION: This exam was performed according to the departmental dose-optimization program which includes automated exposure control, adjustment of the mA and/or kV according to patient size and/or use of iterative reconstruction technique. COMPARISON:  CT Abdomen and Pelvis 09/01/2012. FINDINGS: Lower chest: Negative. Hepatobiliary: Negative noncontrast liver and gallbladder. Pancreas: Negative. Spleen: Negative. Adrenals/Urinary Tract: Normal adrenal glands. Nonobstructed kidneys. No nephrolithiasis or evidence of pararenal inflammation. Decompressed ureters. Diminutive and unremarkable urinary bladder. Chronic pelvic phleboliths but no convincing urinary calculus. Stomach/Bowel: Limited bowel detail due to little visceral fat. No dilated bowel loops. Cecum partially located in the pelvis anteriorly. No bowel inflammation, free air or free fluid identified. Vascular/Lymphatic: Normal caliber abdominal aorta. No calcified atherosclerosis or lymphadenopathy identified. Reproductive: Negative. Other: No pelvis free fluid.  Chronic pelvic phleboliths. Musculoskeletal: Unremarkable visible spine. Small benign appearing sclerotic area in the medial left iliac bone, faintly apparent in 2014. No acute or suspicious osseous finding. IMPRESSION: Negative noncontrast CT Abdomen and Pelvis; No urinary calculus or obstructive uropathy. No acute or inflammatory process identified.  Electronically Signed   By: Odessa Fleming M.D.   On: 01/02/2023 10:13    Procedures Procedures    Medications Ordered in ED Medications  ketorolac (TORADOL) 30 MG/ML injection 30 mg (30 mg Intravenous Given 01/02/23 1036)  sodium chloride 0.9 % bolus 1,000 mL (1,000 mLs Intravenous New Bag/Given 01/02/23 1036)  dexamethasone (DECADRON) injection 10 mg (10 mg Intravenous Given 01/02/23 1036)    ED Course/ Medical Decision Making/ A&P                             Medical Decision Making Amount and/or Complexity of Data Reviewed Labs: ordered. Radiology: ordered.  Risk Prescription drug management.   This patient presents to the ED for concern of back pain, this involves an extensive number of treatment options, and is a  complaint that carries with it a high risk of complications and morbidity.  The differential diagnosis includes msk, kidney stones, sciatica, AAA   Co morbidities that complicate the patient evaluation  asthma   Additional history obtained:  Additional history obtained from epic chart review  Lab Tests:  I Ordered, and personally interpreted labs.  The pertinent results include:  cbc nl, cmp nl, ua nl   Imaging Studies ordered:  I ordered imaging studies including ct renal  I independently visualized and interpreted imaging which showed  Negative noncontrast CT Abdomen and Pelvis;    No urinary calculus or obstructive uropathy. No acute or  inflammatory process identified.   I agree with the radiologist interpretation   Cardiac Monitoring:  The patient was maintained on a cardiac monitor.  I personally viewed and interpreted the cardiac monitored which showed an underlying rhythm of: nsr   Medicines ordered and prescription drug management:  I ordered medication including toradol/decadron  for pain  Reevaluation of the patient after these medicines showed that the patient improved I have reviewed the patients home medicines and have made adjustments  as needed   Test Considered:  ct   Critical Interventions:  Pain meds  Problem List / ED Course:  LBP:  likely msk.  Pt is stable for d/c.  Return if worse.  Establish care with pcp.   Reevaluation:  After the interventions noted above, I reevaluated the patient and found that they have :improved   Social Determinants of Health:  No insurance/pcp   Dispostion:  After consideration of the diagnostic results and the patients response to treatment, I feel that the patent would benefit from discharge with outpatient f/u.          Final Clinical Impression(s) / ED Diagnoses Final diagnoses:  Strain of lumbar region, initial encounter    Rx / DC Orders ED Discharge Orders          Ordered    ibuprofen (ADVIL) 600 MG tablet  Every 6 hours PRN        01/02/23 1127    methocarbamol (ROBAXIN) 500 MG tablet  2 times daily        01/02/23 1127              Jacalyn Lefevre, MD 01/02/23 1258

## 2023-01-02 NOTE — ED Triage Notes (Addendum)
Pt. Stated, Donald Mcfarland had back pain for 8 years that comes and goes. I have episodes of back pain . Its the middle of the back . This episode started  3 days ago. Went to PT yesterday without an order.

## 2023-01-02 NOTE — Discharge Instructions (Addendum)
Continue to do the back exercises given to you by the physical therapist.

## 2023-02-28 ENCOUNTER — Other Ambulatory Visit: Payer: Self-pay

## 2023-02-28 ENCOUNTER — Emergency Department (HOSPITAL_COMMUNITY)
Admission: EM | Admit: 2023-02-28 | Discharge: 2023-02-28 | Disposition: A | Discharge status: Home / Self Care | Payer: BC Managed Care – PPO | Source: Home / Self Care | Attending: Emergency Medicine | Admitting: Emergency Medicine

## 2023-02-28 ENCOUNTER — Encounter (HOSPITAL_COMMUNITY): Payer: Self-pay | Admitting: Emergency Medicine

## 2023-02-28 DIAGNOSIS — M545 Low back pain, unspecified: Secondary | ICD-10-CM | POA: Insufficient documentation

## 2023-02-28 MED ORDER — LIDOCAINE 5 % EX PTCH: 1.0000 | MEDICATED_PATCH | CUTANEOUS | 0 refills | Status: DC

## 2023-02-28 MED ORDER — PREDNISONE 20 MG PO TABS
60.0000 mg | ORAL_TABLET | Freq: Once | ORAL | Status: DC
  Filled 2023-02-28: qty 3

## 2023-02-28 MED ORDER — CYCLOBENZAPRINE HCL 10 MG PO TABS: 10.0000 mg | ORAL_TABLET | Freq: Two times a day (BID) | ORAL | 0 refills | Status: AC | PRN

## 2023-02-28 MED ORDER — KETOROLAC TROMETHAMINE 60 MG/2ML IM SOLN
60.0000 mg | Freq: Once | INTRAMUSCULAR | Status: DC
  Filled 2023-02-28: qty 2

## 2023-02-28 MED ORDER — PREDNISONE 20 MG PO TABS: ORAL_TABLET | ORAL | 0 refills | Status: AC

## 2023-02-28 MED ORDER — CYCLOBENZAPRINE HCL 10 MG PO TABS: 10.0000 mg | ORAL_TABLET | Freq: Two times a day (BID) | ORAL | 0 refills | Status: DC | PRN

## 2023-02-28 MED ORDER — LIDOCAINE 5 % EX PTCH: 1.0000 | MEDICATED_PATCH | CUTANEOUS | 0 refills | Status: AC

## 2023-02-28 MED ORDER — PREDNISONE 20 MG PO TABS: ORAL_TABLET | ORAL | 0 refills | Status: DC

## 2023-02-28 NOTE — ED Triage Notes (Signed)
Patient c/o chronic back pain, worse over the past 2 days.  Patient denies falls or trauma.

## 2023-02-28 NOTE — ED Provider Notes (Signed)
[place] EMERGENCY DEPARTMENT AT [facility] Provider Note   CSN: [id number] Arrival date & time: 02/28/23  0308     History  Chief Complaint  Patient presents with   Back Pain    Donald Mcfarland is a 37 y.o. male.  The history is provided by the patient and medical records.  Back Pain  37 year old male presenting to the ED with back pain.  Says he has somewhat chronic back pain related to old football injury.  He was seen by chiropractor a few years ago and told that his "hips were uneven".  States every so often he gets flareups of pain.  He denies any recent injury, trauma, fall, or heavy lifting.  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  States over the past 2 days pain has seemed worse, started in his right hip and knee which is typical and now feels it all across his lower back.  He denies any numbness or weakness of the legs.  No bowel or bladder incontinence.  He was previously doing PT, however insurance is no longer covering this.  He has not seen a chiropractor in quite some time either.  He tried taking muscle relaxer and Motrin prior to arrival without relief.  Home Medications Prior to Admission medications   Medication Sig Start Date End Date Taking? Authorizing Provider  acetaminophen (TYLENOL) 500 MG tablet Take 500 mg by mouth every 6 (six) hours as needed for mild pain.    [provider]  HYDROcodone-acetaminophen (NORCO/VICODIN) 5-325 MG per tablet Take 1 tablet by mouth every 8 (eight) hours as needed for moderate pain. Patient not taking: Reported on 01/02/2023 06/26/13   Copland, Gwenlyn Found, MD  ibuprofen (ADVIL) 600 MG tablet Take 1 tablet (600 mg total) by mouth every 6 (six) hours as needed. 01/02/23   Jacalyn Lefevre, MD  meloxicam (MOBIC) 7.5 MG tablet Take 1 tablet (7.5 mg total) by mouth daily. Patient not taking: Reported on 01/02/2023 06/26/13   Copland, Gwenlyn Found, MD  methocarbamol (ROBAXIN) 500 MG tablet Take 1 tablet (500 mg total) by mouth 2 (two) times daily. 01/02/23   Jacalyn Lefevre, MD  predniSONE (STERAPRED UNI-PAK 21 TAB) 10 MG (21) TBPK tablet Take as directed on packaging Patient not taking: Reported on 01/02/2023 01/09/21   Evern Core, PA-C      Allergies    Patient has no known allergies.    Review of Systems   Review of Systems  Musculoskeletal:  Positive for back pain.  All other systems reviewed and are negative.   Physical Exam Updated Vital Signs BP 117/76 (BP Location: Right Arm)   Pulse 75   Temp 98.6 F (37 C) (Oral)   Resp 18   Ht 5\' 6"  (1.676 m)   Wt 63.5 kg   SpO2 100%   BMI 22.60 kg/m  Physical Exam Vitals and nursing note reviewed.  Constitutional:      Appearance: He is well-developed.  HENT:     Head: Normocephalic and atraumatic.  Eyes:     Conjunctiva/sclera: Conjunctivae normal.     Pupils: Pupils are equal, round, and reactive to light.  Cardiovascular:     Rate and Rhythm: Normal rate and regular rhythm.     Heart sounds: Normal heart sounds.  Pulmonary:     Effort: Pulmonary effort is normal.     Breath sounds: Normal breath sounds.  Abdominal:     General: Bowel sounds are normal.     Palpations: Abdomen is soft.  Musculoskeletal:        General: Normal range of motion.     Cervical back: Normal range of motion.     Comments: LS without noted deformity, pain elicited with movement but not really focally tender, no step-off, normal strength/sensation of the legs  Skin:    General: Skin is warm and dry.  Neurological:     Mental Status: He is alert and oriented to person, place, and time.     ED Results / Procedures / Treatments   Labs (all labs ordered are listed, but only abnormal results are displayed) Labs Reviewed - No data to display  EKG None  Radiology No results found.  Procedures Procedures    Medications Ordered in ED Medications  ketorolac (TORADOL)  injection 60 mg (has no administration in time range)  predniSONE (DELTASONE) tablet 60 mg (has no administration in time range)    ED Course/ Medical Decision Making/ A&P                                 Medical Decision Making Risk Prescription drug management.   37 y.o. M here with back pain.  Somewhat of a chronic issue from old football injury that flares up from time to time.  No longer doing PT or seeing chiropractor currently  but was previously.  States pain worse over the past 2 days without new injury/trauma.  Afebrile, non-toxic.  Endorses pain along lower back but no acute deformity or step-off.  No neurologic deficits, no incontinence.  No fevers.  Patient did have recent CT imaging 01/02/23 which was negative.  Without new injury or other concerning findings on exam, do not feel repeat imaging is indicated.  Ordered toradol and prednisone as he reports no relief with motrin and robaxin at home.  4:45 AM Patient upset about choice of medication given and has refused treatment stating "I can do this at home."  He has no focal neurologic deficits or other red flag symptoms, seems to be acute on chronic issue.  He will be discharged with symptomatic care.  He was given neurosurgery follow-up if needed.  Return here for new concerns.  Final Clinical Impression(s) / ED Diagnoses Final diagnoses:  Chronic midline low back pain without sciatica    Rx / DC Orders ED Discharge Orders          Ordered    predniSONE (DELTASONE) 20 MG tablet        02/28/23 0448    cyclobenzaprine (FLEXERIL) 10 MG tablet  2 times daily PRN        02/28/23 0448    lidocaine (LIDODERM) 5 %  Every 24 hours        02/28/23 0448              Garlon Hatchet, PA-C 02/28/23 1610    Glynn Octave, MD 02/28/23 [phone]

## 2023-02-28 NOTE — Discharge Instructions (Addendum)
Take the prescribed medication as directed.  Can try using heat/ice as well. Follow-up with neurosurgery-- they can assist with ongoing management, PT, other pain control methods. Return to the ED for new or worsening symptoms.

## 2023-04-03 ENCOUNTER — Ambulatory Visit: Payer: BC Managed Care – PPO | Attending: Neurological Surgery

## 2023-04-03 DIAGNOSIS — M6281 Muscle weakness (generalized): Secondary | ICD-10-CM | POA: Insufficient documentation

## 2023-04-03 DIAGNOSIS — R2689 Other abnormalities of gait and mobility: Secondary | ICD-10-CM | POA: Insufficient documentation

## 2023-04-03 DIAGNOSIS — M5459 Other low back pain: Secondary | ICD-10-CM | POA: Insufficient documentation

## 2023-04-03 NOTE — Therapy (Signed)
OUTPATIENT PHYSICAL THERAPY THORACOLUMBAR EVALUATION   Patient Name: Donald Mcfarland MRN: [id number] DOB:1986-07-18, 37 y.o., male Today's Date: 04/03/2023  END OF SESSION:  PT End of Session - 04/03/23 1636     Visit Number 1    Number of Visits 9    Date for PT Re-Evaluation 05/29/23    Authorization Type BC[employer]    PT Start Time 1615    PT Stop Time 1655    PT Time Calculation (min) 40 min    Activity Tolerance Patient tolerated treatment well    Behavior During Therapy WFL for tasks assessed/performed             Past Medical History:  Diagnosis Date   Asthma    History reviewed. No pertinent surgical history. There are no problems to display for this patient.   PCP: No PCP  REFERRING PROVIDER: Dawley, Alan Mulder, DO   REFERRING DIAG: [phone] (ICD-10-CM) - Spondylosis without myelopathy or radiculopathy, lumbar region   Rationale for Evaluation and Treatment: Rehabilitation  THERAPY DIAG:  Other low back pain - Plan: PT plan of care cert/re-cert  Muscle weakness (generalized) - Plan: PT plan of care cert/re-cert  Other abnormalities of gait and mobility - Plan: PT plan of care cert/re-cert  ONSET DATE: Chronic  SUBJECTIVE:                                                                                                                                                                                           SUBJECTIVE STATEMENT: Pt presents to PT with reports of chronic LBP with very occasional referral of pain in LE. Overt R lower back and R hip pain that also hurts with prolonged standing and walking. Denies N/T lately but did have these symptoms in the past. Also denies bowel/bladder changes or saddle anesthesia.   PERTINENT HISTORY:  None  PAIN:  Are you having pain?  Yes: NPRS scale: 4/10 Worst: 10/10 Pain location: lower back Pain description: sharp, "hot" Aggravating factors: prolonged standing, waking Relieving factors: rest,  medication  PRECAUTIONS: None  RED FLAGS: None   WEIGHT BEARING RESTRICTIONS: No  FALLS:  Has patient fallen in last 6 months? No  LIVING ENVIRONMENT: Lives with: lives with their family Lives in: House/apartment  OCCUPATION: [employer]   PLOF: Independent  PATIENT GOALS: decrease back pain, get back to working out and moving around a little better  OBJECTIVE:   DIAGNOSTIC FINDINGS:  N/A  PATIENT SURVEYS:  FOTO: 59% function; 69% predicted  COGNITION: Overall cognitive status: Within functional limits for tasks assessed     SENSATION: WFL  MUSCLE LENGTH: Hamstrings: Right 35 deg;  Left 40 deg Thomas test: Right (+); Left (+)  POSTURE: rounded shoulders and forward head  PALPATION: TTP to R sided lumbar paraspinals  LUMBAR ROM:   AROM eval  Flexion WFL  Extension 50% reduced  Right lateral flexion   Left lateral flexion   Right rotation WFL  Left rotation Reduced with pain   (Blank rows = not tested)  LOWER EXTREMITY MMT:    MMT Right eval Left eval  Hip flexion 3+/5 3+/5  Hip extension    Hip abduction 3+/5 3+/5  Hip adduction    Hip internal rotation    Hip external rotation    Knee flexion    Knee extension    Ankle dorsiflexion    Ankle plantarflexion    Ankle inversion    Ankle eversion     (Blank rows = not tested)  LUMBAR SPECIAL TESTS:  Straight leg raise test: Negative and Slump test: Negative  FUNCTIONAL TESTS:  30 Second Sit to Stand: 6 reps with UE  GAIT: Distance walked: 20ft Assistive device utilized: None Level of assistance: Complete Independence Comments: decreased gait speed, trunk flexed  TREATMENT: [facility] Adult PT Treatment:                                                DATE: 04/03/2023 Therapeutic Exercise: Supine PPT x 5 - 5" hold Bridge x 5 Modified thomas stretch x 60" each Supine clamshell x 10 GTB Supine hamstring stretch with strap x 30" each  PATIENT EDUCATION:  Education details: eval findings,  FOTO, HEP, POC Person educated: Patient Education method: Explanation, Demonstration, and Handouts Education comprehension: verbalized understanding and returned demonstration  HOME EXERCISE PROGRAM: Access Code: [email or web address] URL: [email or web address] Date: 04/03/2023 Prepared by: Edwinna Areola  Exercises - Supine Posterior Pelvic Tilt  - 1 x daily - 7 x weekly - 2 sets - 10 reps - 3 sec hold - Supine Bridge  - 1 x daily - 7 x weekly - 2 sets - 10 reps - Modified Thomas Stretch  - 1 x daily - 7 x weekly - 2 reps - 60" hold - Hooklying Clamshell with Resistance  - 1 x daily - 7 x weekly - 3 sets - 10 reps - Donald band hold - Supine Hamstring Stretch with Strap  - 1 x daily - 7 x weekly - 2 reps - 30 sec hold  ASSESSMENT:  CLINICAL IMPRESSION: Patient is a 37 y.o. M who was seen today for physical therapy evaluation and treatment for chronic LBP with occasional radicular symptoms. Physical findings are consistent with physician impression as pt demonstrates decrease in core/hip strength and general functional mobility secondary to LBP. FOTO score notes subjective functional ability below PLOF. Pt would benefit from skilled PT services working on neutral spine core strengthening to decrease back pain and improve function.   OBJECTIVE IMPAIRMENTS: decreased activity tolerance, decreased endurance, decreased mobility, difficulty walking, decreased ROM, decreased strength, and pain   ACTIVITY LIMITATIONS: lifting, sitting, standing, squatting, stairs, bed mobility, and locomotion level  PARTICIPATION LIMITATIONS: driving, shopping, community activity, occupation, and yard work  PERSONAL FACTORS: Time since onset of injury/illness/exacerbation are also affecting patient's functional outcome.   REHAB POTENTIAL: Excellent  CLINICAL DECISION MAKING: Stable/uncomplicated  EVALUATION COMPLEXITY: Low   GOALS: Goals reviewed with patient? No  SHORT TERM GOALS: Target date:  04/24/2023   Pt will  be compliant and knowledgeable with initial HEP for improved comfort and carryover Baseline: initial HEP given  Goal status: INITIAL  2.  Pt will self report lower back pain no greater than 6/10 for improved comfort and functional ability Baseline: 10/10 at worst Goal status: INITIAL   LONG TERM GOALS: Target date: 05/29/2023   Pt will improve FOTO function score to no less than 69% as proxy for functional improvement Baseline: 59% function Goal status: INITIAL   2.  Pt will self report lower back pain no greater than 3/10 for improved comfort and functional ability Baseline: 10/10 at worst Goal status: INITIAL   3.  Pt will increase 30 Second Sit to Stand rep count to no less than 10 reps for improved balance, strength, and functional mobility Baseline: 6 reps with UE Goal status: INITIAL   4.  Pt will improve bilateral LE MMT to no less than 5/5 for improved functional mobility and decreased pain Baseline:  Goal status: INITIAL   PLAN:  PT FREQUENCY: 1x/week  PT DURATION: 8 weeks  PLANNED INTERVENTIONS: Therapeutic exercises, Therapeutic activity, Neuromuscular re-education, Balance training, Gait training, Patient/Family education, Self Care, Joint mobilization, Aquatic Therapy, Dry Needling, Cryotherapy, Moist heat, Vasopneumatic device, Manual therapy, and Re-evaluation.  PLAN FOR NEXT SESSION: assess HEP response, neutral spine core strength, proximal hip strength   Eloy End, PT 04/03/2023, 5:15 PM

## 2023-04-07 ENCOUNTER — Ambulatory Visit: Payer: BC Managed Care – PPO

## 2023-04-07 DIAGNOSIS — M5459 Other low back pain: Secondary | ICD-10-CM

## 2023-04-07 DIAGNOSIS — M6281 Muscle weakness (generalized): Secondary | ICD-10-CM

## 2023-04-07 NOTE — Therapy (Signed)
OUTPATIENT PHYSICAL THERAPY THORACOLUMBAR EVALUATION   Patient Name: Donald Mcfarland MRN: [id number] DOB:08/08/1985, 37 y.o., male Today's Date: 04/07/2023  END OF SESSION:  PT End of Session - 04/07/23 1606     Visit Number 2    Number of Visits 9    Date for PT Re-Evaluation 05/29/23    Authorization Type BCBS    PT Start Time 1607    PT Stop Time 1647    PT Time Calculation (min) 40 min    Activity Tolerance Patient tolerated treatment well    Behavior During Therapy WFL for tasks assessed/performed              Past Medical History:  Diagnosis Date   Asthma    History reviewed. No pertinent surgical history. There are no problems to display for this patient.   PCP: No PCP  REFERRING PROVIDER: Dawley, Troy C, DO   REFERRING DIAG: [phone] (ICD-10-CM) - Spondylosis without myelopathy or radiculopathy, lumbar region   Rationale for Evaluation and Treatment: Rehabilitation  THERAPY DIAG:  Other low back pain  Muscle weakness (generalized)  ONSET DATE: Chronic  SUBJECTIVE:                                                                                                                                                                                           SUBJECTIVE STATEMENT: Pt presents to PT with reports of continued lower back soreness. Has been compliant with HEP with no adverse effect.   PERTINENT HISTORY:  None  PAIN:  Are you having pain?  Yes: NPRS scale: 4/10 Worst: 10/10 Pain location: lower back Pain description: sharp, "hot" Aggravating factors: prolonged standing, waking Relieving factors: rest, medication  PRECAUTIONS: None  RED FLAGS: None   WEIGHT BEARING RESTRICTIONS: No  FALLS:  Has patient fallen in last 6 months? No  LIVING ENVIRONMENT: Lives with: lives with their family Lives in: House/apartment  OCCUPATION: [employer]   PLOF: Independent  PATIENT GOALS: decrease back pain, get back to working out and moving around  a little better  OBJECTIVE:   DIAGNOSTIC FINDINGS:  N/A  PATIENT SURVEYS:  FOTO: 59% function; 69% predicted  COGNITION: Overall cognitive status: Within functional limits for tasks assessed     SENSATION: WFL  MUSCLE LENGTH: Hamstrings: Right 35 deg; Left 40 deg Thomas test: Right (+); Left (+)  POSTURE: rounded shoulders and forward head  PALPATION: TTP to R sided lumbar paraspinals  LUMBAR ROM:   AROM eval  Flexion WFL  Extension 50% reduced  Right lateral flexion   Left lateral flexion   Right rotation WFL  Left rotation Reduced with pain   (  Blank rows = not tested)  LOWER EXTREMITY MMT:    MMT Right eval Left eval  Hip flexion 3+/5 3+/5  Hip extension    Hip abduction 3+/5 3+/5  Hip adduction    Hip internal rotation    Hip external rotation    Knee flexion    Knee extension    Ankle dorsiflexion    Ankle plantarflexion    Ankle inversion    Ankle eversion     (Blank rows = not tested)  LUMBAR SPECIAL TESTS:  Straight leg raise test: Negative and Slump test: Negative  FUNCTIONAL TESTS:  30 Second Sit to Stand: 6 reps with UE  GAIT: Distance walked: 34ft Assistive device utilized: None Level of assistance: Complete Independence Comments: decreased gait speed, trunk flexed  TREATMENT: [facility] Adult PT Treatment:                                                DATE: 04/07/2023 Therapeutic Exercise: NuStep lvl 6 UE/LE x 4 min while taking subjective LTR x 10 Supine PPT x 10 - 5" hold Supine PPT 2x10 - 5" hold Supine clamshell 2x15 GTB Supine SLR 2x10 Bridge 2x10 Supine hamstring stretch with strap x 60" each Modified thomas stretch x 60" each Physioball traction stretch 5x15"  [facility] Adult PT Treatment:                                                DATE: 04/03/2023 Therapeutic Exercise: Supine PPT x 5 - 5" hold Bridge x 5 Modified thomas stretch x 60" each Supine clamshell x 10 GTB Supine hamstring stretch with strap x 30"  each  PATIENT EDUCATION:  Education details: eval findings, FOTO, HEP, POC Person educated: Patient Education method: Explanation, Demonstration, and Handouts Education comprehension: verbalized understanding and returned demonstration  HOME EXERCISE PROGRAM: Access Code: [email or web address] URL: [email or web address] Date: 04/03/2023 Prepared by: Edwinna Areola  Exercises - Supine Posterior Pelvic Tilt  - 1 x daily - 7 x weekly - 2 sets - 10 reps - 3 sec hold - Supine Bridge  - 1 x daily - 7 x weekly - 2 sets - 10 reps - Modified Thomas Stretch  - 1 x daily - 7 x weekly - 2 reps - 60" hold - Hooklying Clamshell with Resistance  - 1 x daily - 7 x weekly - 3 sets - 10 reps - green band hold - Supine Hamstring Stretch with Strap  - 1 x daily - 7 x weekly - 2 reps - 30 sec hold  ASSESSMENT:  CLINICAL IMPRESSION: Pt was able to complete all prescribed exercises with no adverse effect. Therapy today focused on neutral spine core strength and proximal hip strength/flexibility. Pt noted 0/10 pain post session. Will continue to progress as able per POC.   OBJECTIVE IMPAIRMENTS: decreased activity tolerance, decreased endurance, decreased mobility, difficulty walking, decreased ROM, decreased strength, and pain   ACTIVITY LIMITATIONS: lifting, sitting, standing, squatting, stairs, bed mobility, and locomotion level  PARTICIPATION LIMITATIONS: driving, shopping, community activity, occupation, and yard work  PERSONAL FACTORS: Time since onset of injury/illness/exacerbation are also affecting patient's functional outcome.   REHAB POTENTIAL: Excellent  CLINICAL DECISION MAKING: Stable/uncomplicated  EVALUATION COMPLEXITY: Low   GOALS: Goals reviewed  with patient? No  SHORT TERM GOALS: Target date: 04/24/2023   Pt will be compliant and knowledgeable with initial HEP for improved comfort and carryover Baseline: initial HEP given  Goal status: INITIAL  2.  Pt will self report lower  back pain no greater than 6/10 for improved comfort and functional ability Baseline: 10/10 at worst Goal status: INITIAL   LONG TERM GOALS: Target date: 05/29/2023   Pt will improve FOTO function score to no less than 69% as proxy for functional improvement Baseline: 59% function Goal status: INITIAL   2.  Pt will self report lower back pain no greater than 3/10 for improved comfort and functional ability Baseline: 10/10 at worst Goal status: INITIAL   3.  Pt will increase 30 Second Sit to Stand rep count to no less than 10 reps for improved balance, strength, and functional mobility Baseline: 6 reps with UE Goal status: INITIAL   4.  Pt will improve bilateral LE MMT to no less than 5/5 for improved functional mobility and decreased pain Baseline:  Goal status: INITIAL   PLAN:  PT FREQUENCY: 1x/week  PT DURATION: 8 weeks  PLANNED INTERVENTIONS: Therapeutic exercises, Therapeutic activity, Neuromuscular re-education, Balance training, Gait training, Patient/Family education, Self Care, Joint mobilization, Aquatic Therapy, Dry Needling, Cryotherapy, Moist heat, Vasopneumatic device, Manual therapy, and Re-evaluation.  PLAN FOR NEXT SESSION: assess HEP response, neutral spine core strength, proximal hip strength   Eloy End, PT 04/07/2023, 4:52 PM

## 2023-04-21 ENCOUNTER — Ambulatory Visit: Payer: BC Managed Care – PPO | Admitting: Physical Therapy

## 2023-04-21 ENCOUNTER — Telehealth: Payer: Self-pay | Admitting: Physical Therapy

## 2023-04-21 NOTE — Telephone Encounter (Signed)
Attempted to contact patient due to missed PT appointment. Left voicemail informing patient of the missed appointment, next scheduled appointment, and attendance policy.   Rosana Hoes, PT, DPT, LAT, ATC 04/21/23  5:19 PM Phone: [phone] Fax: [phone]

## 2023-04-30 ENCOUNTER — Ambulatory Visit: Payer: BC Managed Care – PPO | Attending: Neurological Surgery

## 2023-04-30 DIAGNOSIS — R2689 Other abnormalities of gait and mobility: Secondary | ICD-10-CM | POA: Insufficient documentation

## 2023-04-30 DIAGNOSIS — M6281 Muscle weakness (generalized): Secondary | ICD-10-CM | POA: Diagnosis present

## 2023-04-30 DIAGNOSIS — M5459 Other low back pain: Secondary | ICD-10-CM | POA: Insufficient documentation

## 2023-04-30 NOTE — Therapy (Signed)
OUTPATIENT PHYSICAL THERAPY TREATMENT   Patient Name: Donald Mcfarland MRN: [id number] DOB:Jul 12, 1986, 37 y.o., male Today's Date: 04/30/2023   END OF SESSION:  PT End of Session - 04/30/23 1610     Visit Number 3    Number of Visits 9    Date for PT Re-Evaluation 05/29/23    Authorization Type BCBS    PT Start Time 1615    PT Stop Time 1655    PT Time Calculation (min) 40 min    Activity Tolerance Patient tolerated treatment well    Behavior During Therapy WFL for tasks assessed/performed               Past Medical History:  Diagnosis Date   Asthma    History reviewed. No pertinent surgical history. There are no problems to display for this patient.   PCP: No PCP  REFERRING PROVIDER: Dawley, Troy C, DO   REFERRING DIAG: [phone] (ICD-10-CM) - Spondylosis without myelopathy or radiculopathy, lumbar region   Rationale for Evaluation and Treatment: Rehabilitation  THERAPY DIAG:  Other low back pain  Muscle weakness (generalized)  Other abnormalities of gait and mobility  ONSET DATE: Chronic   SUBJECTIVE:                                                                                                                                                                                          SUBJECTIVE STATEMENT: Pain is less intense and frequent.  Symptoms worse with increased activity as well as prolonged standing/walking.   PERTINENT HISTORY:  None  PAIN:  Are you having pain?  Yes: NPRS scale: 4/10 Worst: 10/10 Pain location: lower back Pain description: sharp, "hot" Aggravating factors: prolonged standing, waking Relieving factors: rest, medication  PRECAUTIONS: None  PATIENT GOALS: decrease back pain, get back to working out and moving around a little better   OBJECTIVE:  PATIENT SURVEYS:  FOTO: 59% function; 69% predicted  MUSCLE LENGTH: Hamstrings: Right 35 deg; Left 40 deg Thomas test: Right (+); Left (+)  POSTURE: rounded shoulders and  forward head  PALPATION: TTP to R sided lumbar paraspinals  LUMBAR ROM:   AROM eval  Flexion WFL  Extension 50% reduced  Right lateral flexion   Left lateral flexion   Right rotation WFL  Left rotation Reduced with pain   (Blank rows = not tested)  LOWER EXTREMITY MMT:    MMT Right eval Left eval  Hip flexion 3+/5 3+/5  Hip extension    Hip abduction 3+/5 3+/5  Hip adduction    Hip internal rotation    Hip external rotation    Knee flexion  Knee extension    Ankle dorsiflexion    Ankle plantarflexion    Ankle inversion    Ankle eversion     (Blank rows = not tested)  LUMBAR SPECIAL TESTS:  Straight leg raise test: Negative and Slump test: Negative  FUNCTIONAL TESTS:  30 Second Sit to Stand: 6 reps with UE  GAIT: Distance walked: 48ft Assistive device utilized: None Level of assistance: Complete Independence Comments: decreased gait speed, trunk flexed   TREATMENT: [facility] Adult PT Treatment:                                                DATE: 04/30/23 Therapeutic Exercise: NuStep lvl 4 UE/LE x 8 min while taking subjective Curl ups with p-ball 15x B, 15/15 unilaterally QL stretch 30s x2 B Supine PPT with alt. Marching 3# 10/10 Modified thomas stretch 30s x2 B Prone on elbows 2 min Prone press x10 No distinct leg length discrepancy observed, (-) stork sign B  [facility] Adult PT Treatment:                                                DATE: 04/21/2023 Therapeutic Exercise: NuStep lvl 6 UE/LE x 4 min while taking subjective LTR x 10 Supine PPT x 10 - 5" hold Supine PPT 2x10 - 5" hold Supine clamshell 2x15 GTB Supine SLR 2x10 Bridge 2x10 Supine hamstring stretch with strap x 60" each Modified thomas stretch x 60" each Physioball traction stretch 5x15"   [facility] Adult PT Treatment:                                                DATE: 04/07/2023 Therapeutic Exercise: NuStep lvl 6 UE/LE x 4 min while taking subjective LTR x 10 Supine PPT x 10 - 5"  hold Supine PPT 2x10 - 5" hold Supine clamshell 2x15 GTB Supine SLR 2x10 Bridge 2x10 Supine hamstring stretch with strap x 60" each Modified thomas stretch x 60" each Physioball traction stretch 5x15"  [facility] Adult PT Treatment:                                                DATE: 04/03/2023 Therapeutic Exercise: Supine PPT x 5 - 5" hold Bridge x 5 Modified thomas stretch x 60" each Supine clamshell x 10 GTB Supine hamstring stretch with strap x 30" each  PATIENT EDUCATION:  Education details: HEP Person educated: Patient Education method: Programmer, multimedia, Facilities manager, and Handouts Education comprehension: verbalized understanding and returned demonstration  HOME EXERCISE PROGRAM: Access Code: [email or web address] URL: [email or web address] Date: 04/03/2023 Prepared by: Edwinna Areola  Exercises - Supine Posterior Pelvic Tilt  - 1 x daily - 7 x weekly - 2 sets - 10 reps - 3 sec hold - Supine Bridge  - 1 x daily - 7 x weekly - 2 sets - 10 reps - Modified Thomas Stretch  - 1 x daily - 7 x weekly - 2 reps - 60" hold - Hooklying Clamshell with  Resistance  - 1 x daily - 7 x weekly - 3 sets - 10 reps - green band hold - Supine Hamstring Stretch with Strap  - 1 x daily - 7 x weekly - 2 reps - 30 sec hold   ASSESSMENT: CLINICAL IMPRESSION: Focus today was flexibility and core strength.  Brief assessment of leg length discrepancy and SI joints did not identify any dysfunction.  Added tasks to promote lumbar extension ROM and provide intersegmental mobility.  Discussed potential TPDN to relieve taught paraspinals  Pt was able to complete all prescribed exercises with no adverse effect. Therapy today focused on neutral spine core strength and proximal hip strength/flexibility. Pt noted 0/10 pain post session. Will continue to progress as able per POC.   OBJECTIVE IMPAIRMENTS: decreased activity tolerance, decreased endurance, decreased mobility, difficulty walking, decreased ROM, decreased  strength, and pain   ACTIVITY LIMITATIONS: lifting, sitting, standing, squatting, stairs, bed mobility, and locomotion level  PARTICIPATION LIMITATIONS: driving, shopping, community activity, occupation, and yard work  PERSONAL FACTORS: Time since onset of injury/illness/exacerbation are also affecting patient's functional outcome.    GOALS: Goals reviewed with patient? No  SHORT TERM GOALS: Target date: 04/24/2023   Pt will be compliant and knowledgeable with initial HEP for improved comfort and carryover Baseline: initial HEP given  04/21/2023: progressing Goal status: ONGOING  2.  Pt will self report lower back pain no greater than 6/10 for improved comfort and functional ability Baseline: 10/10 at worst 04/21/2023: 10/10 Goal status: ONGOING  LONG TERM GOALS: Target date: 05/29/2023   Pt will improve FOTO function score to no less than 69% as proxy for functional improvement Baseline: 59% function Goal status: INITIAL   2.  Pt will self report lower back pain no greater than 3/10 for improved comfort and functional ability Baseline: 10/10 at worst Goal status: INITIAL   3.  Pt will increase 30 Second Sit to Stand rep count to no less than 10 reps for improved balance, strength, and functional mobility Baseline: 6 reps with UE Goal status: INITIAL   4.  Pt will improve bilateral LE MMT to no less than 5/5 for improved functional mobility and decreased pain Baseline:  Goal status: INITIAL   PLAN: PT FREQUENCY: 1x/week  PT DURATION: 8 weeks  PLANNED INTERVENTIONS: Therapeutic exercises, Therapeutic activity, Neuromuscular re-education, Balance training, Gait training, Patient/Family education, Self Care, Joint mobilization, Aquatic Therapy, Dry Needling, Cryotherapy, Moist heat, Vasopneumatic device, Manual therapy, and Re-evaluation.  PLAN FOR NEXT SESSION: assess HEP response, neutral spine core strength, proximal hip strength   Rosana Hoes, PT, DPT, LAT,  ATC 04/30/23  5:01 PM Phone: [phone] Fax: [phone]

## 2023-05-20 ENCOUNTER — Encounter: Payer: Self-pay | Admitting: Physical Therapy

## 2023-05-20 ENCOUNTER — Other Ambulatory Visit: Payer: Self-pay

## 2023-05-20 ENCOUNTER — Ambulatory Visit: Payer: BC Managed Care – PPO | Admitting: Physical Therapy

## 2023-05-20 DIAGNOSIS — M6281 Muscle weakness (generalized): Secondary | ICD-10-CM

## 2023-05-20 DIAGNOSIS — M5459 Other low back pain: Secondary | ICD-10-CM | POA: Diagnosis not present

## 2023-05-20 DIAGNOSIS — R2689 Other abnormalities of gait and mobility: Secondary | ICD-10-CM

## 2023-05-20 NOTE — Therapy (Signed)
OUTPATIENT PHYSICAL THERAPY TREATMENT   Patient Name: Donald Mcfarland MRN: [id number] DOB:14-Jan-1986, 37 y.o., male Today's Date: 05/20/2023   END OF SESSION:  PT End of Session - 05/20/23 1652     Visit Number 4    Number of Visits 9    Date for PT Re-Evaluation 05/29/23    Authorization Type BCBS    PT Start Time 1615    PT Stop Time 1655    PT Time Calculation (min) 40 min    Activity Tolerance Patient tolerated treatment well    Behavior During Therapy WFL for tasks assessed/performed                Past Medical History:  Diagnosis Date   Asthma    History reviewed. No pertinent surgical history. There are no problems to display for this patient.   PCP: No PCP  REFERRING PROVIDER: Dawley, Alan Mulder, DO   REFERRING DIAG: [phone] (ICD-10-CM) - Spondylosis without myelopathy or radiculopathy, lumbar region   Rationale for Evaluation and Treatment: Rehabilitation  THERAPY DIAG:  Other low back pain  Muscle weakness (generalized)  Other abnormalities of gait and mobility  ONSET DATE: Chronic   SUBJECTIVE:                                                                                                                                                                                          SUBJECTIVE STATEMENT:  Patient reports back has been feeling pretty good for past week or two, not as tight as normal. He is tired from work. He may get tightness every now and then but it goes away.   PERTINENT HISTORY:  None  PAIN:  Are you having pain?  Yes: NPRS scale: 1-2/10 Worst: 10/10 Pain location: lower back Pain description: Tightness Aggravating factors: prolonged standing, waking Relieving factors: rest, medication  PRECAUTIONS: None  PATIENT GOALS: decrease back pain, get back to working out and moving around a little better   OBJECTIVE:  PATIENT SURVEYS:  FOTO: 59% function; 69% predicted  MUSCLE LENGTH: Hamstrings: Right 35 deg; Left 40  deg Thomas test: Right (+); Left (+)  POSTURE: rounded shoulders and forward head  PALPATION: TTP to R sided lumbar paraspinals  LUMBAR ROM:   AROM eval 05/20/2023  Flexion WFL   Extension 50% reduced   Right lateral flexion    Left lateral flexion    Right rotation WFL   Left rotation Reduced with pain WFL   (Blank rows = not tested)  LOWER EXTREMITY MMT:    MMT Right eval Left eval  Hip flexion 3+/5 3+/5  Hip extension    Hip  abduction 3+/5 3+/5  Hip adduction    Hip internal rotation    Hip external rotation    Knee flexion    Knee extension    Ankle dorsiflexion    Ankle plantarflexion    Ankle inversion    Ankle eversion     (Blank rows = not tested)  LUMBAR SPECIAL TESTS:  Straight leg raise test: Negative and Slump test: Negative  FUNCTIONAL TESTS:  30 Second Sit to Stand: 6 reps with UE  GAIT: Distance walked: 26ft Assistive device utilized: None Level of assistance: Complete Independence Comments: decreased gait speed, trunk flexed   TREATMENT: [facility] Adult PT Treatment:                                                DATE: 05/20/2023 Therapeutic Exercise: NuStep L6 x 5 min with UE/LE while taking subjective Seated hamstring stretch 2 x 20 sec each LTR 5 x 5 sec each Hooklying QL stretch 2 x 20 sec each Modified thomas stretch 2 x 30 sec each Sidelying thoracolumbar stretch 10 x 5 sec each Bridge 2 x 10 90-90 alternating foot tap 2 x 20 Standing QL stretch at doorframe 2 x 20 sec each Pallof press with black 2 x 10 each   [facility] Adult PT Treatment:                                                DATE: 04/30/23 Therapeutic Exercise: NuStep lvl 4 UE/LE x 8 min while taking subjective Curl ups with p-ball 15x B, 15/15 unilaterally QL stretch 30s x2 B Supine PPT with alt. Marching 3# 10/10 Modified thomas stretch 30s x2 B Prone on elbows 2 min Prone press x10 No distinct leg length discrepancy observed, (-) stork sign B  [facility] Adult PT  Treatment:                                                DATE: 04/21/2023 Therapeutic Exercise: NuStep lvl 6 UE/LE x 4 min while taking subjective LTR x 10 Supine PPT x 10 - 5" hold Supine PPT 2x10 - 5" hold Supine clamshell 2x15 GTB Supine SLR 2x10 Bridge 2x10 Supine hamstring stretch with strap x 60" each Modified thomas stretch x 60" each Physioball traction stretch 5x15"  [facility] Adult PT Treatment:                                                DATE: 04/07/2023 Therapeutic Exercise: NuStep lvl 6 UE/LE x 4 min while taking subjective LTR x 10 Supine PPT x 10 - 5" hold Supine PPT 2x10 - 5" hold Supine clamshell 2x15 GTB Supine SLR 2x10 Bridge 2x10 Supine hamstring stretch with strap x 60" each Modified thomas stretch x 60" each Physioball traction stretch 5x15"  [facility] Adult PT Treatment:  DATE: 04/03/2023 Therapeutic Exercise: Supine PPT x 5 - 5" hold Bridge x 5 Modified thomas stretch x 60" each Supine clamshell x 10 GTB Supine hamstring stretch with strap x 30" each  PATIENT EDUCATION:  Education details: HEP update Person educated: Patient Education method: Explanation, Demonstration, and Handouts Education comprehension: verbalized understanding and returned demonstration  HOME EXERCISE PROGRAM: Access Code: [email or web address] URL: [email or web address] Date: 04/03/2023 Prepared by: Edwinna Areola  Exercises - Supine Posterior Pelvic Tilt  - 1 x daily - 7 x weekly - 2 sets - 10 reps - 3 sec hold - Supine Bridge  - 1 x daily - 7 x weekly - 2 sets - 10 reps - Modified Thomas Stretch  - 1 x daily - 7 x weekly - 2 reps - 60" hold - Hooklying Clamshell with Resistance  - 1 x daily - 7 x weekly - 3 sets - 10 reps - green band hold - Supine Hamstring Stretch with Strap  - 1 x daily - 7 x weekly - 2 reps - 30 sec hold   ASSESSMENT: CLINICAL IMPRESSION:  Patient tolerated therapy well with no adverse effects. Therapy  focused on progressing lumbar mobility and core strengthening with good tolerance. He does continue to report QL tightness so provided updated HEP to progress stretching for home. He was able to progress his core stabilization exercises with good tolerance. He was able to maintain good spinal control with exercises. Patient would benefit from continued skilled PT to progress his mobility and strength in order to reduce pain and maximize functional ability.    OBJECTIVE IMPAIRMENTS: decreased activity tolerance, decreased endurance, decreased mobility, difficulty walking, decreased ROM, decreased strength, and pain   ACTIVITY LIMITATIONS: lifting, sitting, standing, squatting, stairs, bed mobility, and locomotion level  PARTICIPATION LIMITATIONS: driving, shopping, community activity, occupation, and yard work  PERSONAL FACTORS: Time since onset of injury/illness/exacerbation are also affecting patient's functional outcome.    GOALS: Goals reviewed with patient? No  SHORT TERM GOALS: Target date: 04/24/2023   Pt will be compliant and knowledgeable with initial HEP for improved comfort and carryover Baseline: initial HEP given  04/21/2023: progressing Goal status: ONGOING  2.  Pt will self report lower back pain no greater than 6/10 for improved comfort and functional ability Baseline: 10/10 at worst 04/21/2023: 10/10 Goal status: ONGOING  LONG TERM GOALS: Target date: 05/29/2023   Pt will improve FOTO function score to no less than 69% as proxy for functional improvement Baseline: 59% function Goal status: INITIAL   2.  Pt will self report lower back pain no greater than 3/10 for improved comfort and functional ability Baseline: 10/10 at worst Goal status: INITIAL   3.  Pt will increase 30 Second Sit to Stand rep count to no less than 10 reps for improved balance, strength, and functional mobility Baseline: 6 reps with UE Goal status: INITIAL   4.  Pt will improve bilateral LE MMT  to no less than 5/5 for improved functional mobility and decreased pain Baseline:  Goal status: INITIAL   PLAN: PT FREQUENCY: 1x/week  PT DURATION: 8 weeks  PLANNED INTERVENTIONS: Therapeutic exercises, Therapeutic activity, Neuromuscular re-education, Balance training, Gait training, Patient/Family education, Self Care, Joint mobilization, Aquatic Therapy, Dry Needling, Cryotherapy, Moist heat, Vasopneumatic device, Manual therapy, and Re-evaluation.  PLAN FOR NEXT SESSION: assess HEP response, neutral spine core strength, proximal hip strength   Rosana Hoes, PT, DPT, LAT, ATC 05/20/23  5:00 PM Phone: [phone] Fax: [phone]

## 2023-05-20 NOTE — Patient Instructions (Signed)
Access Code: [email or web address] URL: [email or web address] Date: 05/20/2023 Prepared by: Rosana Hoes  Exercises - Supine Posterior Pelvic Tilt  - 1 x daily - 7 x weekly - 2 sets - 10 reps - 3 sec hold - Supine Bridge  - 1 x daily - 7 x weekly - 2 sets - 10 reps - Modified Thomas Stretch  - 1 x daily - 7 x weekly - 2 reps - 60" hold - Hooklying Clamshell with Resistance  - 1 x daily - 7 x weekly - 3 sets - 10 reps - green band hold - Supine Hamstring Stretch with Strap  - 1 x daily - 7 x weekly - 2 reps - 30 sec hold - Sidelying Lumbar Rotation Stretch  - 1 x daily - 7 x weekly - 10 reps - 5 seconds hold - Standing Quadratus Lumborum Stretch with Doorway  - 1 x daily - 7 x weekly - 3 reps - 30 seconds hold

## 2023-05-28 ENCOUNTER — Ambulatory Visit: Payer: BC Managed Care – PPO | Attending: Neurological Surgery | Admitting: Physical Therapy

## 2023-05-28 ENCOUNTER — Encounter: Payer: Self-pay | Admitting: Physical Therapy

## 2023-05-28 ENCOUNTER — Other Ambulatory Visit: Payer: Self-pay

## 2023-05-28 DIAGNOSIS — M6281 Muscle weakness (generalized): Secondary | ICD-10-CM | POA: Diagnosis present

## 2023-05-28 DIAGNOSIS — M5459 Other low back pain: Secondary | ICD-10-CM | POA: Diagnosis present

## 2023-05-28 DIAGNOSIS — R2689 Other abnormalities of gait and mobility: Secondary | ICD-10-CM | POA: Insufficient documentation

## 2023-05-28 NOTE — Therapy (Signed)
OUTPATIENT PHYSICAL THERAPY TREATMENT   Patient Name: Donald Mcfarland MRN: [id number] DOB:08/27/1985, 37 y.o., male Today's Date: 05/28/2023   END OF SESSION:  PT End of Session - 05/28/23 1618     Visit Number 5    Number of Visits 9    Date for PT Re-Evaluation 05/29/23    Authorization Type [employer]    PT Start Time 1615    PT Stop Time 1657    PT Time Calculation (min) 42 min    Activity Tolerance Patient tolerated treatment well    Behavior During Therapy WFL for tasks assessed/performed                 Past Medical History:  Diagnosis Date   Asthma    History reviewed. No pertinent surgical history. There are no problems to display for this patient.   PCP: No PCP  REFERRING PROVIDER: Dawley, Troy C, DO   REFERRING DIAG: [phone] (ICD-10-CM) - Spondylosis without myelopathy or radiculopathy, lumbar region   Rationale for Evaluation and Treatment: Rehabilitation  THERAPY DIAG:  Other low back pain  Muscle weakness (generalized)  Other abnormalities of gait and mobility  ONSET DATE: Chronic   SUBJECTIVE:                                                                                                                                                                                          SUBJECTIVE STATEMENT:  Patient reports he is doing good.  Still having some lower back and hip tightness.  PERTINENT HISTORY:  None  PAIN:  Are you having pain?  Yes: NPRS scale: 2/10 Worst: 10/10 Pain location: lower back Pain description: Tightness Aggravating factors: prolonged standing, waking Relieving factors: rest, medication  PRECAUTIONS: None  PATIENT GOALS: decrease back pain, get back to working out and moving around a little better   OBJECTIVE:  PATIENT SURVEYS:  FOTO: 59% function; 69% predicted  05/28/2023: 65%  MUSCLE LENGTH: Hamstrings: Right 35 deg; Left 40 deg Thomas test: Right (+); Left (+)  POSTURE: rounded shoulders and forward  head  PALPATION: TTP to R sided lumbar paraspinals  LUMBAR ROM:   AROM eval 05/20/2023  Flexion WFL   Extension 50% reduced   Right lateral flexion    Left lateral flexion    Right rotation WFL   Left rotation Reduced with pain WFL   (Blank rows = not tested)  LOWER EXTREMITY MMT:    MMT Right eval Left eval Rt / Lt 05/28/2023  Hip flexion 3+/5 3+/5 4 / 4  Hip extension   4- / 4-  Hip abduction 3+/5 3+/5 4- / 4-  Hip adduction     Hip internal rotation     Hip external rotation     Knee flexion   5 / 5  Knee extension   5 / 5  Ankle dorsiflexion     Ankle plantarflexion     Ankle inversion     Ankle eversion      (Blank rows = not tested)  LUMBAR SPECIAL TESTS:  Straight leg raise test: Negative and Slump test: Negative  FUNCTIONAL TESTS:  30 Second Sit to Stand: 6 reps with UE  05/28/2023: 10 reps without UE support  GAIT: Distance walked: 41ft Assistive device utilized: None Level of assistance: Complete Independence Comments: decreased gait speed, trunk flexed   TREATMENT: [facility] Adult PT Treatment:                                                DATE: 05/28/2023 Therapeutic Exercise: NuStep L6 x 5 min with UE/LE while taking subjective Sidelying thoracolumbar stretch x 10 each LTR x 10 Standing QL stretch at doorframe 2 x 20 sec each Modified side plank 3 x 20 sec each 90-90 alternating foot tap 2 x 20 Figure-4 bridge 2 x 10 each Pallof press with skinny power band 2 x 10 each Deadlift with 30# 2 x 10   [facility] Adult PT Treatment:                                                DATE: 05/20/2023 Therapeutic Exercise: NuStep L6 x 5 min with UE/LE while taking subjective Seated hamstring stretch 2 x 20 sec each LTR 5 x 5 sec each Hooklying QL stretch 2 x 20 sec each Modified thomas stretch 2 x 30 sec each Sidelying thoracolumbar stretch 10 x 5 sec each Bridge 2 x 10 90-90 alternating foot tap 2 x 20 Standing QL stretch at doorframe 2 x 20 sec  each Pallof press with black 2 x 10 each  [facility] Adult PT Treatment:                                                DATE: 04/30/23 Therapeutic Exercise: NuStep lvl 4 UE/LE x 8 min while taking subjective Curl ups with p-ball 15x B, 15/15 unilaterally QL stretch 30s x2 B Supine PPT with alt. Marching 3# 10/10 Modified thomas stretch 30s x2 B Prone on elbows 2 min Prone press x10 No distinct leg length discrepancy observed, (-) stork sign B  [facility] Adult PT Treatment:                                                DATE: 04/21/2023 Therapeutic Exercise: NuStep lvl 6 UE/LE x 4 min while taking subjective LTR x 10 Supine PPT x 10 - 5" hold Supine PPT 2x10 - 5" hold Supine clamshell 2x15 GTB Supine SLR 2x10 Bridge 2x10 Supine hamstring stretch with strap x 60" each Modified thomas stretch x 60" each Physioball traction stretch 5x15"  [facility]  Adult PT Treatment:                                                DATE: 04/07/2023 Therapeutic Exercise: NuStep lvl 6 UE/LE x 4 min while taking subjective LTR x 10 Supine PPT x 10 - 5" hold Supine PPT 2x10 - 5" hold Supine clamshell 2x15 GTB Supine SLR 2x10 Bridge 2x10 Supine hamstring stretch with strap x 60" each Modified thomas stretch x 60" each Physioball traction stretch 5x15"  [facility] Adult PT Treatment:                                                DATE: 04/03/2023 Therapeutic Exercise: Supine PPT x 5 - 5" hold Bridge x 5 Modified thomas stretch x 60" each Supine clamshell x 10 GTB Supine hamstring stretch with strap x 30" each  PATIENT EDUCATION:  Education details: POC extension, HEP Person educated: Patient Education method: Programmer, multimedia, Demonstration Education comprehension: verbalized understanding and returned demonstration  HOME EXERCISE PROGRAM: Access Code: [email or web address]   ASSESSMENT: CLINICAL IMPRESSION:  Patient tolerated therapy well with no adverse effects. Therapy focused on progressing lumbar mobility and core  strengthening with good tolerance. He does report an improvement in his functional status on FOTO and demonstrates improvement in his core and LE strength, but continues to report pain in his lower back. Initiated lifting this visit and progress core stabilization exercises. No changes to his HEP this visit. Patient would benefit from continued skilled PT to progress his mobility and strength in order to reduce pain and maximize functional ability, so will extend his PT POC for 6 more weeks.     OBJECTIVE IMPAIRMENTS: decreased activity tolerance, decreased endurance, decreased mobility, difficulty walking, decreased ROM, decreased strength, and pain   ACTIVITY LIMITATIONS: lifting, sitting, standing, squatting, stairs, bed mobility, and locomotion level  PARTICIPATION LIMITATIONS: driving, shopping, community activity, occupation, and yard work  PERSONAL FACTORS: Time since onset of injury/illness/exacerbation are also affecting patient's functional outcome.    GOALS: Goals reviewed with patient? No  SHORT TERM GOALS: Target date: = LTG   Pt will be compliant and knowledgeable with initial HEP for improved comfort and carryover Baseline: initial HEP given  04/21/2023: progressing 05/28/2023: progressing Goal status: ONGOING  2.  Pt will self report lower back pain no greater than 6/10 for improved comfort and functional ability Baseline: 10/10 at worst 04/21/2023: 10/10 05/28/2023: still rates worst 10/10 but hasn't happened since last doctor visit Goal status: ONGOING  LONG TERM GOALS: Target date: 07/09/2023   Pt will improve FOTO function score to no less than 69% as proxy for functional improvement Baseline: 59% function 05/28/2023: 65% Goal status: PARTIALLY MET  2.  Pt will self report lower back pain no greater than 3/10 for improved comfort and functional ability Baseline: 10/10 at worst 05/28/2023: still rates worst 10/10 but hasn't happened since last doctor visit Goal  status: ONGOING  3.  Pt will increase 30 Second Sit to Stand rep count to no less than 10 reps for improved balance, strength, and functional mobility Baseline: 6 reps with UE 05/28/2023: 10 reps without UE support Goal status: MET  4.  Pt will improve bilateral LE MMT to no less than 5/5  for improved functional mobility and decreased pain Baseline:  05/28/2023: see limitations above Goal status: PARTIALLY MET   PLAN: PT FREQUENCY: 1x/week  PT DURATION: 8 weeks  PLANNED INTERVENTIONS: Therapeutic exercises, Therapeutic activity, Neuromuscular re-education, Balance training, Gait training, Patient/Family education, Self Care, Joint mobilization, Aquatic Therapy, Dry Needling, Cryotherapy, Moist heat, Vasopneumatic device, Manual therapy, and Re-evaluation.  PLAN FOR NEXT SESSION: assess HEP response, neutral spine core strength, proximal hip strength   Rosana Hoes, PT, DPT, LAT, ATC 05/28/23  4:58 PM Phone: [phone] Fax: [phone]

## 2023-06-10 ENCOUNTER — Ambulatory Visit: Payer: BC Managed Care – PPO

## 2023-06-10 DIAGNOSIS — R2689 Other abnormalities of gait and mobility: Secondary | ICD-10-CM

## 2023-06-10 DIAGNOSIS — M5459 Other low back pain: Secondary | ICD-10-CM | POA: Diagnosis not present

## 2023-06-10 DIAGNOSIS — M6281 Muscle weakness (generalized): Secondary | ICD-10-CM

## 2023-06-10 NOTE — Therapy (Addendum)
 OUTPATIENT PHYSICAL THERAPY TREATMENT NOTE/DISCHARGE  PHYSICAL THERAPY DISCHARGE SUMMARY  Visits from Start of Care: 6  Current functional level related to goals / functional outcomes: See goals/objective   Remaining deficits: Unable to assess   Education / Equipment: HEP   Patient agrees to discharge. Patient goals were unable to assess. Patient is being discharged due to not returning since the last visit.     Patient Name: Donald Mcfarland MRN: [id number] DOB:27-Dec-1985, 37 y.o., male Today's Date: 06/11/2023   END OF SESSION:  PT End of Session - 06/10/23 1605     Visit Number 6    Number of Visits 9    Date for PT Re-Evaluation 05/29/23    Authorization Type [employer]    PT Start Time 1615    PT Stop Time 1655    PT Time Calculation (min) 40 min    Activity Tolerance Patient tolerated treatment well    Behavior During Therapy WFL for tasks assessed/performed                  Past Medical History:  Diagnosis Date   Asthma    History reviewed. No pertinent surgical history. There are no problems to display for this patient.   PCP: No PCP  REFERRING PROVIDER: Dawley, Troy C, DO   REFERRING DIAG: [phone] (ICD-10-CM) - Spondylosis without myelopathy or radiculopathy, lumbar region   Rationale for Evaluation and Treatment: Rehabilitation  THERAPY DIAG:  Other low back pain  Muscle weakness (generalized)  Other abnormalities of gait and mobility  ONSET DATE: Chronic   SUBJECTIVE:                                                                                                                                                                                          SUBJECTIVE STATEMENT:  Patient reports he is doing well overall. Has been compliant with HEP.  PERTINENT HISTORY:  None  PAIN:  Are you having pain?  Yes: NPRS scale: 3/10 Worst: 10/10 Pain location: lower back Pain description: Tightness Aggravating factors: prolonged standing,  waking Relieving factors: rest, medication  PRECAUTIONS: None  PATIENT GOALS: decrease back pain, get back to working out and moving around a little better   OBJECTIVE:  PATIENT SURVEYS:  FOTO: 59% function; 69% predicted  05/28/2023: 65%  MUSCLE LENGTH: Hamstrings: Right 35 deg; Left 40 deg Thomas test: Right (+); Left (+)  POSTURE: rounded shoulders and forward head  PALPATION: TTP to R sided lumbar paraspinals  LUMBAR ROM:   AROM eval 05/20/2023  Flexion WFL   Extension 50% reduced   Right lateral flexion    Left lateral flexion  Right rotation [facility]   Left rotation Reduced with pain WFL   (Blank rows = not tested)  LOWER EXTREMITY MMT:    MMT Right eval Left eval Rt / Lt 05/28/2023  Hip flexion 3+/5 3+/5 4 / 4  Hip extension   4- / 4-  Hip abduction 3+/5 3+/5 4- / 4-  Hip adduction     Hip internal rotation     Hip external rotation     Knee flexion   5 / 5  Knee extension   5 / 5  Ankle dorsiflexion     Ankle plantarflexion     Ankle inversion     Ankle eversion      (Blank rows = not tested)  LUMBAR SPECIAL TESTS:  Straight leg raise test: Negative and Slump test: Negative  FUNCTIONAL TESTS:  30 Second Sit to Stand: 6 reps with UE  05/28/2023: 10 reps without UE support  GAIT: Distance walked: 80ft Assistive device utilized: None Level of assistance: Complete Independence Comments: decreased gait speed, trunk flexed   TREATMENT: [facility] Adult PT Treatment:                                                DATE: 06/10/2023 Therapeutic Exercise: NuStep L7 x 4 min with UE/LE while taking subjective Modified thomas stretch x 60 each LTR x 10 Bridge with blue band 2x10 Supine pilates SLR 2x15 each 90/90 hold 2x20 90/90 alt tap 3x10 Pallof press 2x10 10# Row 2x10 20# Standing QL stretch at doorframe 2x20 sec each Standing hip abd x 15 25#  [facility] Adult PT Treatment:                                                DATE: 05/28/2023 Therapeutic  Exercise: NuStep L6 x 5 min with UE/LE while taking subjective Sidelying thoracolumbar stretch x 10 each LTR x 10 Standing QL stretch at doorframe 2 x 20 sec each Modified side plank 3 x 20 sec each 90-90 alternating foot tap 2 x 20 Figure-4 bridge 2 x 10 each Pallof press with skinny power band 2 x 10 each Deadlift with 30# 2 x 10   [facility] Adult PT Treatment:                                                DATE: 05/20/2023 Therapeutic Exercise: NuStep L6 x 5 min with UE/LE while taking subjective Seated hamstring stretch 2 x 20 sec each LTR 5 x 5 sec each Hooklying QL stretch 2 x 20 sec each Modified thomas stretch 2 x 30 sec each Sidelying thoracolumbar stretch 10 x 5 sec each Bridge 2 x 10 90-90 alternating foot tap 2 x 20 Standing QL stretch at doorframe 2 x 20 sec each Pallof press with black 2 x 10 each  [facility] Adult PT Treatment:  DATE: 04/30/23 Therapeutic Exercise: NuStep lvl 4 UE/LE x 8 min while taking subjective Curl ups with p-ball 15x B, 15/15 unilaterally QL stretch 30s x2 B Supine PPT with alt. Marching 3# 10/10 Modified thomas stretch 30s x2 B Prone on elbows 2 min Prone press x10 No distinct leg length discrepancy observed, (-) stork sign B  [facility] Adult PT Treatment:                                                DATE: 04/21/2023 Therapeutic Exercise: NuStep lvl 6 UE/LE x 4 min while taking subjective LTR x 10 Supine PPT x 10 - 5 hold Supine PPT 2x10 - 5 hold Supine clamshell 2x15 GTB Supine SLR 2x10 Bridge 2x10 Supine hamstring stretch with strap x 60 each Modified thomas stretch x 60 each Physioball traction stretch 5x15  [facility] Adult PT Treatment:                                                DATE: 04/07/2023 Therapeutic Exercise: NuStep lvl 6 UE/LE x 4 min while taking subjective LTR x 10 Supine PPT x 10 - 5 hold Supine PPT 2x10 - 5 hold Supine clamshell 2x15 GTB Supine SLR 2x10 Bridge  2x10 Supine hamstring stretch with strap x 60 each Modified thomas stretch x 60 each Physioball traction stretch 5x15  [facility] Adult PT Treatment:                                                DATE: 04/03/2023 Therapeutic Exercise: Supine PPT x 5 - 5 hold Bridge x 5 Modified thomas stretch x 60 each Supine clamshell x 10 GTB Supine hamstring stretch with strap x 30 each  PATIENT EDUCATION:  Education details: POC extension, HEP Person educated: Patient Education method: Programmer, multimedia, Demonstration Education comprehension: verbalized understanding and returned demonstration  HOME EXERCISE PROGRAM: Access Code: [email or web address]   ASSESSMENT: CLINICAL IMPRESSION:  Pt was able to complete all prescribed exercises with no adverse effect. Therapy today focused on neutral spine core strengthening and proximal hip strength/flexibility. Will continue to progress as able per POC.  OBJECTIVE IMPAIRMENTS: decreased activity tolerance, decreased endurance, decreased mobility, difficulty walking, decreased ROM, decreased strength, and pain   ACTIVITY LIMITATIONS: lifting, sitting, standing, squatting, stairs, bed mobility, and locomotion level  PARTICIPATION LIMITATIONS: driving, shopping, community activity, occupation, and yard work  PERSONAL FACTORS: Time since onset of injury/illness/exacerbation are also affecting patient's functional outcome.    GOALS: Goals reviewed with patient? No  SHORT TERM GOALS: Target date: = LTG   Pt will be compliant and knowledgeable with initial HEP for improved comfort and carryover Baseline: initial HEP given  04/21/2023: progressing 05/28/2023: progressing Goal status: ONGOING  2.  Pt will self report lower back pain no greater than 6/10 for improved comfort and functional ability Baseline: 10/10 at worst 04/21/2023: 10/10 05/28/2023: still rates worst 10/10 but hasn't happened since last doctor visit Goal status: ONGOING  LONG TERM GOALS: Target  date: 07/09/2023   Pt will improve FOTO function score to no less than 69% as proxy for functional improvement Baseline:  59% function 05/28/2023: 65% Goal status: PARTIALLY MET  2.  Pt will self report lower back pain no greater than 3/10 for improved comfort and functional ability Baseline: 10/10 at worst 05/28/2023: still rates worst 10/10 but hasn't happened since last doctor visit Goal status: ONGOING  3.  Pt will increase 30 Second Sit to Stand rep count to no less than 10 reps for improved balance, strength, and functional mobility Baseline: 6 reps with UE 05/28/2023: 10 reps without UE support Goal status: MET  4.  Pt will improve bilateral LE MMT to no less than 5/5 for improved functional mobility and decreased pain Baseline:  05/28/2023: see limitations above Goal status: PARTIALLY MET   PLAN: PT FREQUENCY: 1x/week  PT DURATION: 8 weeks  PLANNED INTERVENTIONS: Therapeutic exercises, Therapeutic activity, Neuromuscular re-education, Balance training, Gait training, Patient/Family education, Self Care, Joint mobilization, Aquatic Therapy, Dry Needling, Cryotherapy, Moist heat, Vasopneumatic device, Manual therapy, and Re-evaluation.  PLAN FOR NEXT SESSION: assess HEP response, neutral spine core strength, proximal hip strength   Alm JAYSON Kingdom PT  06/11/23 8:00 AM

## 2023-06-18 ENCOUNTER — Ambulatory Visit: Payer: BC Managed Care – PPO | Admitting: Physical Therapy
